# Patient Record
Sex: Male | Born: 1955 | Race: Black or African American | Hispanic: No | Marital: Single | Smoking: Current some day smoker
Health system: Southern US, Community
[De-identification: ages and names within clinical notes are randomized; demographics above are authoritative.]

## PROBLEM LIST (undated history)

## (undated) DIAGNOSIS — F419 Anxiety disorder, unspecified: Secondary | ICD-10-CM

## (undated) DIAGNOSIS — M549 Dorsalgia, unspecified: Secondary | ICD-10-CM

## (undated) HISTORY — PX: ROTATOR CUFF REPAIR: SHX139

---

## 2009-03-31 ENCOUNTER — Emergency Department (HOSPITAL_COMMUNITY): Admission: EM | Admit: 2009-03-31 | Discharge: 2009-03-31 | Payer: Self-pay | Admitting: Emergency Medicine

## 2009-09-15 ENCOUNTER — Emergency Department (HOSPITAL_COMMUNITY): Admission: EM | Admit: 2009-09-15 | Discharge: 2009-09-15 | Payer: Self-pay | Admitting: Emergency Medicine

## 2009-09-16 ENCOUNTER — Emergency Department (HOSPITAL_COMMUNITY): Admission: EM | Admit: 2009-09-16 | Discharge: 2009-09-16 | Payer: Self-pay | Admitting: Emergency Medicine

## 2009-09-21 ENCOUNTER — Emergency Department (HOSPITAL_COMMUNITY): Admission: EM | Admit: 2009-09-21 | Discharge: 2009-09-21 | Payer: Self-pay | Admitting: Emergency Medicine

## 2009-10-02 ENCOUNTER — Emergency Department (HOSPITAL_COMMUNITY): Admission: EM | Admit: 2009-10-02 | Discharge: 2009-10-02 | Payer: Self-pay | Admitting: Emergency Medicine

## 2009-12-22 ENCOUNTER — Emergency Department (HOSPITAL_COMMUNITY): Admission: EM | Admit: 2009-12-22 | Discharge: 2009-12-22 | Payer: Self-pay | Admitting: Emergency Medicine

## 2010-06-16 ENCOUNTER — Ambulatory Visit: Payer: Self-pay | Admitting: Physician Assistant

## 2010-06-16 ENCOUNTER — Telehealth: Payer: Self-pay | Admitting: Physician Assistant

## 2010-06-16 DIAGNOSIS — K219 Gastro-esophageal reflux disease without esophagitis: Secondary | ICD-10-CM

## 2010-06-16 DIAGNOSIS — M545 Low back pain: Secondary | ICD-10-CM

## 2010-06-16 LAB — CONVERTED CEMR LAB
AST: 28 units/L (ref 0–37)
Albumin: 4.5 g/dL (ref 3.5–5.2)
Alkaline Phosphatase: 70 units/L (ref 39–117)
BUN: 14 mg/dL (ref 6–23)
Basophils Relative: 0 % (ref 0–1)
Blood in Urine, dipstick: NEGATIVE
Eosinophils Absolute: 0.1 10*3/uL (ref 0.0–0.7)
Eosinophils Relative: 2 % (ref 0–5)
HCT: 41.7 % (ref 39.0–52.0)
Ketones, urine, test strip: NEGATIVE
MCHC: 32.6 g/dL (ref 30.0–36.0)
MCV: 92.5 fL (ref 78.0–100.0)
Monocytes Relative: 6 % (ref 3–12)
Neutrophils Relative %: 59 % (ref 43–77)
Nitrite: NEGATIVE
Platelets: 275 10*3/uL (ref 150–400)
Potassium: 4.6 meq/L (ref 3.5–5.3)
Protein, U semiquant: NEGATIVE
Sodium: 138 meq/L (ref 135–145)
Total Bilirubin: 0.2 mg/dL — ABNORMAL LOW (ref 0.3–1.2)
WBC Urine, dipstick: NEGATIVE

## 2010-06-17 ENCOUNTER — Telehealth: Payer: Self-pay | Admitting: Physician Assistant

## 2010-06-17 ENCOUNTER — Encounter: Payer: Self-pay | Admitting: Physician Assistant

## 2010-06-25 ENCOUNTER — Ambulatory Visit: Payer: Self-pay | Admitting: Physician Assistant

## 2010-06-25 LAB — CONVERTED CEMR LAB
Amphetamine Screen, Ur: NEGATIVE
Benzodiazepines.: NEGATIVE
Cocaine Metabolites: NEGATIVE
Marijuana Metabolite: NEGATIVE
Opiate Screen, Urine: NEGATIVE
Phencyclidine (PCP): NEGATIVE
Propoxyphene: NEGATIVE

## 2010-06-28 ENCOUNTER — Encounter: Payer: Self-pay | Admitting: Physician Assistant

## 2010-08-27 ENCOUNTER — Encounter (INDEPENDENT_AMBULATORY_CARE_PROVIDER_SITE_OTHER): Payer: Self-pay | Admitting: *Deleted

## 2010-09-01 ENCOUNTER — Encounter: Admission: RE | Admit: 2010-09-01 | Discharge: 2010-09-01 | Payer: Self-pay | Admitting: Nephrology

## 2010-10-13 ENCOUNTER — Encounter: Admit: 2010-10-13 | Payer: Self-pay | Admitting: Internal Medicine

## 2010-11-16 NOTE — Progress Notes (Signed)
Summary: Did not receive urine for Drug screen  Phone Note From Other Clinic   Summary of Call: Call from [facility] lab -- had order for drug screen, but no urine was received.   Initial call taken by: Dutch Quint RN,  June 17, 2010 12:26 PM  Follow-up for Phone Call        needs to be repeated  Follow-up by: Brynda Rim,  June 17, 2010 2:16 PM  Additional Follow-up for Phone Call Additional follow up Details #1::        Lab appt. 06/24/10.  Dutch Quint RN  June 22, 2010 9:41 AM

## 2010-11-16 NOTE — Letter (Signed)
Summary: *HSN Results Follow up  [facility]  [address] [place], [place] [address]   Phone: [phone]  Fax: [phone]      06/28/2010   Levaughn Glynn [address] [place], [place]  [address]   Dear  Mr. Newman Nip,                            ____S.Drinkard,FNP   ____D. Gore,FNP       ____B. McPherson,MD   ____V. Rankins,MD    ____E. Mulberry,MD    ____N. Daphine Deutscher, FNP  ____D. Reche Dixon, MD    ____K. Philipp Deputy, MD    __x__S. Alben Spittle, PA-C     This letter is to inform you that your recent test(s):  _______Pap Smear    __x____Lab Test     _______X-ray    ___x____ is within acceptable limits  _______ requires a medication change  _______ requires a follow-up lab visit  _______ requires a follow-up visit with your provider   Comments: Urine test is ok.       _________________________________________________________ If you have any questions, please contact our office                     Sincerely,  Tereso Newcomer PA-C [facility]

## 2010-11-16 NOTE — Letter (Signed)
Summary: *HSN Results Follow up  [facility]  [address] [place], [place] [address]   Phone: [phone]  Fax: [phone]      08/27/2010   Trevel Hinton [address] [place], [place]  [address]   Dear  Mr. Newman Nip,                            ____S.Drinkard,FNP   ____D. Gore,FNP       ____B. McPherson,MD   ____V. Rankins,MD    ____E. Mulberry,MD    ____N. Daphine Deutscher, FNP  ____D. Reche Dixon, MD    ____K. Philipp Deputy, MD    __X__Other     This letter is to inform you that your recent test(s):  _______Pap Smear    _______Lab Test     _______X-ray    _______ is within acceptable limits  _______ requires a medication change  _______ requires a follow-up lab visit  _______ requires a follow-up visit with your provider   Comments: I BEING TRYING TO CONTACT YOU BY PHONE REGARDING YOUR NOS TO YOUR PHYSICAL THERAPY .IF YOU CAN PLEASE RETURN MY CALL  OR CALL [phone] TO RESCHEDULE YOUR APPT. THANK YOU       _________________________________________________________ If you have any questions, please contact our office                     Sincerely,  Cheryll Dessert [facility]

## 2010-11-16 NOTE — Letter (Signed)
Summary: MAILED REQUESTED RECORDS [place] [facility] [place]  MAILED REQUESTED RECORDS [place] [facility] [place]   Imported By: Arta Bruce 06/25/2010 10:47:41  _____________________________________________________________________  External Attachment:    Type:   Image     Comment:   External Document
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
## 2010-11-16 NOTE — Progress Notes (Signed)
Summary: PT referral  Phone Note Outgoing Call   Summary of Call: Refer to PT for low back pain  Initial call taken by: Brynda Rim,  June 16, 2010 5:52 PM  Follow-up for Phone Call        PT HAVE AN APPT PT 07-01-10 @ 11:45AM  LVM TO PT TO RETURN MY CALL AND I WILL GIVE HIM HIS APPT  Follow-up by: Cheryll Dessert,  June 23, 2010 11:32 AM

## 2010-11-16 NOTE — Letter (Signed)
Summary: *HSN Results Follow up  [facility]  [address] [place], [place] [address]   Phone: [phone]  Fax: [phone]      06/17/2010   Askia Balik [address] [place], [place]  [address]   Dear  Mr. Newman Nip,                            ____S.Drinkard,FNP   ____D. Gore,FNP       ____B. McPherson,MD   ____V. Rankins,MD    ____E. Mulberry,MD    ____N. Daphine Deutscher, FNP  ____D. Reche Dixon, MD    ____K. Philipp Deputy, MD    __x__S. Alben Spittle, PA-C     This letter is to inform you that your recent test(s):  _______Pap Smear    ___x____Lab Test     _______X-ray    ___x____ is within acceptable limits  _______ requires a medication change  ___x____ requires a follow-up lab visit  _______ requires a follow-up visit with your provider   Comments: Blood counts, liver and kidney function and thyroid all normal.  Please call us to arrange a lab appointment to recollect your urine.       _________________________________________________________ If you have any questions, please contact our office                     Sincerely,  Tereso Newcomer PA-C [facility]

## 2010-11-30 ENCOUNTER — Emergency Department (HOSPITAL_COMMUNITY)
Admission: EM | Admit: 2010-11-30 | Discharge: 2010-11-30 | Disposition: A | Discharge status: Home / Self Care | Payer: Medicaid Other | Attending: Emergency Medicine | Admitting: Emergency Medicine

## 2010-11-30 DIAGNOSIS — M545 Low back pain, unspecified: Secondary | ICD-10-CM | POA: Insufficient documentation

## 2010-11-30 DIAGNOSIS — M25519 Pain in unspecified shoulder: Secondary | ICD-10-CM | POA: Insufficient documentation

## 2010-11-30 DIAGNOSIS — F329 Major depressive disorder, single episode, unspecified: Secondary | ICD-10-CM | POA: Insufficient documentation

## 2010-11-30 DIAGNOSIS — F3289 Other specified depressive episodes: Secondary | ICD-10-CM | POA: Insufficient documentation

## 2010-12-02 ENCOUNTER — Emergency Department (HOSPITAL_COMMUNITY): Payer: Medicaid Other

## 2010-12-02 ENCOUNTER — Emergency Department (HOSPITAL_COMMUNITY)
Admission: EM | Admit: 2010-12-02 | Discharge: 2010-12-02 | Disposition: A | Discharge status: Home / Self Care | Payer: Medicaid Other | Attending: Emergency Medicine | Admitting: Emergency Medicine

## 2010-12-02 DIAGNOSIS — S92919A Unspecified fracture of unspecified toe(s), initial encounter for closed fracture: Secondary | ICD-10-CM | POA: Insufficient documentation

## 2010-12-02 DIAGNOSIS — W208XXA Other cause of strike by thrown, projected or falling object, initial encounter: Secondary | ICD-10-CM | POA: Insufficient documentation

## 2011-03-31 ENCOUNTER — Encounter (HOSPITAL_BASED_OUTPATIENT_CLINIC_OR_DEPARTMENT_OTHER)
Admission: RE | Admit: 2011-03-31 | Discharge: 2011-03-31 | Disposition: A | Discharge status: Home / Self Care | Payer: Medicaid Other | Source: Ambulatory Visit | Attending: Orthopedic Surgery | Admitting: Orthopedic Surgery

## 2011-04-04 ENCOUNTER — Ambulatory Visit (HOSPITAL_BASED_OUTPATIENT_CLINIC_OR_DEPARTMENT_OTHER)
Admission: RE | Admit: 2011-04-04 | Discharge: 2011-04-04 | Disposition: A | Discharge status: Home / Self Care | Payer: Medicaid Other | Source: Ambulatory Visit | Attending: Orthopedic Surgery | Admitting: Orthopedic Surgery

## 2011-04-04 DIAGNOSIS — F17200 Nicotine dependence, unspecified, uncomplicated: Secondary | ICD-10-CM | POA: Insufficient documentation

## 2011-04-04 DIAGNOSIS — M67919 Unspecified disorder of synovium and tendon, unspecified shoulder: Secondary | ICD-10-CM | POA: Insufficient documentation

## 2011-04-04 DIAGNOSIS — M19019 Primary osteoarthritis, unspecified shoulder: Secondary | ICD-10-CM | POA: Insufficient documentation

## 2011-04-04 DIAGNOSIS — J449 Chronic obstructive pulmonary disease, unspecified: Secondary | ICD-10-CM | POA: Insufficient documentation

## 2011-04-04 DIAGNOSIS — Z01810 Encounter for preprocedural cardiovascular examination: Secondary | ICD-10-CM | POA: Insufficient documentation

## 2011-04-04 DIAGNOSIS — J4489 Other specified chronic obstructive pulmonary disease: Secondary | ICD-10-CM | POA: Insufficient documentation

## 2011-04-04 DIAGNOSIS — Z01812 Encounter for preprocedural laboratory examination: Secondary | ICD-10-CM | POA: Insufficient documentation

## 2011-04-04 DIAGNOSIS — M25819 Other specified joint disorders, unspecified shoulder: Secondary | ICD-10-CM | POA: Insufficient documentation

## 2011-04-04 DIAGNOSIS — G43909 Migraine, unspecified, not intractable, without status migrainosus: Secondary | ICD-10-CM | POA: Insufficient documentation

## 2011-04-04 DIAGNOSIS — M719 Bursopathy, unspecified: Secondary | ICD-10-CM | POA: Insufficient documentation
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
## 2011-06-28 ENCOUNTER — Ambulatory Visit: Payer: Medicaid Other | Attending: Orthopedic Surgery | Admitting: Physical Therapy

## 2011-06-28 DIAGNOSIS — M25619 Stiffness of unspecified shoulder, not elsewhere classified: Secondary | ICD-10-CM | POA: Insufficient documentation

## 2011-06-28 DIAGNOSIS — IMO0001 Reserved for inherently not codable concepts without codable children: Secondary | ICD-10-CM | POA: Insufficient documentation

## 2011-06-28 DIAGNOSIS — M25519 Pain in unspecified shoulder: Secondary | ICD-10-CM | POA: Insufficient documentation

## 2011-06-28 DIAGNOSIS — M6281 Muscle weakness (generalized): Secondary | ICD-10-CM | POA: Insufficient documentation

## 2011-06-28 DIAGNOSIS — R5381 Other malaise: Secondary | ICD-10-CM | POA: Insufficient documentation

## 2011-07-04 ENCOUNTER — Ambulatory Visit: Payer: Medicaid Other

## 2011-07-08 ENCOUNTER — Emergency Department (HOSPITAL_COMMUNITY): Payer: Medicaid Other

## 2011-07-08 ENCOUNTER — Emergency Department (HOSPITAL_COMMUNITY)
Admission: EM | Admit: 2011-07-08 | Discharge: 2011-07-08 | Disposition: A | Discharge status: Home / Self Care | Payer: Medicaid Other | Attending: Emergency Medicine | Admitting: Emergency Medicine

## 2011-07-08 DIAGNOSIS — R0602 Shortness of breath: Secondary | ICD-10-CM | POA: Insufficient documentation

## 2011-07-08 DIAGNOSIS — R0989 Other specified symptoms and signs involving the circulatory and respiratory systems: Secondary | ICD-10-CM | POA: Insufficient documentation

## 2011-07-08 DIAGNOSIS — F411 Generalized anxiety disorder: Secondary | ICD-10-CM | POA: Insufficient documentation

## 2011-07-08 DIAGNOSIS — R0609 Other forms of dyspnea: Secondary | ICD-10-CM | POA: Insufficient documentation

## 2011-07-08 DIAGNOSIS — R079 Chest pain, unspecified: Secondary | ICD-10-CM | POA: Insufficient documentation

## 2011-07-08 LAB — DIFFERENTIAL
Basophils Relative: 0 % (ref 0–1)
Lymphocytes Relative: 23 % (ref 12–46)
Lymphs Abs: 2.3 10*3/uL (ref 0.7–4.0)
Monocytes Absolute: 0.6 10*3/uL (ref 0.1–1.0)
Monocytes Relative: 6 % (ref 3–12)
Neutro Abs: 6.9 10*3/uL (ref 1.7–7.7)
Neutrophils Relative %: 71 % (ref 43–77)

## 2011-07-08 LAB — D-DIMER, QUANTITATIVE: D-Dimer, Quant: <0.22 ug/mL-FEU (ref 0.00–0.48)

## 2011-07-08 LAB — BASIC METABOLIC PANEL
CO2: 24 mEq/L (ref 19–32)
Calcium: 8.9 mg/dL (ref 8.4–10.5)
Chloride: 97 mEq/L (ref 96–112)
Creatinine, Ser: 0.86 mg/dL (ref 0.50–1.35)
Glucose, Bld: 136 mg/dL — ABNORMAL HIGH (ref 70–99)

## 2011-07-08 LAB — CBC
HCT: 41.3 % (ref 39.0–52.0)
Hemoglobin: 14.2 g/dL (ref 13.0–17.0)
MCH: 30.4 pg (ref 26.0–34.0)
MCV: 88.4 fL (ref 78.0–100.0)
Platelets: 271 10*3/uL (ref 150–400)
RBC: 4.67 MIL/uL (ref 4.22–5.81)
WBC: 9.8 10*3/uL (ref 4.0–10.5)

## 2011-07-08 LAB — POCT I-STAT TROPONIN I
Troponin i, poc: 0 ng/mL (ref 0.00–0.08)
Troponin i, poc: 0 ng/mL (ref 0.00–0.08)

## 2011-07-19 ENCOUNTER — Ambulatory Visit: Payer: Medicaid Other | Attending: Orthopedic Surgery | Admitting: Physical Therapy

## 2011-07-19 DIAGNOSIS — IMO0001 Reserved for inherently not codable concepts without codable children: Secondary | ICD-10-CM | POA: Insufficient documentation

## 2011-07-19 DIAGNOSIS — R5381 Other malaise: Secondary | ICD-10-CM | POA: Insufficient documentation

## 2011-07-19 DIAGNOSIS — M25619 Stiffness of unspecified shoulder, not elsewhere classified: Secondary | ICD-10-CM | POA: Insufficient documentation

## 2011-07-19 DIAGNOSIS — M6281 Muscle weakness (generalized): Secondary | ICD-10-CM | POA: Insufficient documentation

## 2011-07-19 DIAGNOSIS — M25519 Pain in unspecified shoulder: Secondary | ICD-10-CM | POA: Insufficient documentation

## 2011-07-26 ENCOUNTER — Encounter: Payer: Medicaid Other | Admitting: Physical Therapy

## 2011-08-01 ENCOUNTER — Encounter: Payer: Medicaid Other | Admitting: Physical Therapy

## 2011-08-18 ENCOUNTER — Emergency Department (HOSPITAL_COMMUNITY)
Admission: EM | Admit: 2011-08-18 | Discharge: 2011-08-18 | Disposition: A | Discharge status: Home / Self Care | Payer: Medicaid Other | Attending: Emergency Medicine | Admitting: Emergency Medicine

## 2011-08-18 DIAGNOSIS — Z79899 Other long term (current) drug therapy: Secondary | ICD-10-CM | POA: Insufficient documentation

## 2011-08-18 DIAGNOSIS — R071 Chest pain on breathing: Secondary | ICD-10-CM | POA: Insufficient documentation

## 2011-08-18 DIAGNOSIS — M549 Dorsalgia, unspecified: Secondary | ICD-10-CM | POA: Insufficient documentation

## 2011-08-18 DIAGNOSIS — G8929 Other chronic pain: Secondary | ICD-10-CM | POA: Insufficient documentation

## 2011-08-18 DIAGNOSIS — F411 Generalized anxiety disorder: Secondary | ICD-10-CM | POA: Insufficient documentation

## 2011-08-18 LAB — DIFFERENTIAL
Basophils Relative: 0 % (ref 0–1)
Eosinophils Absolute: 0.1 10*3/uL (ref 0.0–0.7)
Neutro Abs: 3.6 10*3/uL (ref 1.7–7.7)
Neutrophils Relative %: 63 % (ref 43–77)

## 2011-08-18 LAB — POCT I-STAT, CHEM 8
Calcium, Ion: 1.12 mmol/L (ref 1.12–1.32)
Chloride: 107 mEq/L (ref 96–112)
HCT: 41 % (ref 39.0–52.0)
Sodium: 140 mEq/L (ref 135–145)
TCO2: 25 mmol/L (ref 0–100)

## 2011-08-18 LAB — URINALYSIS, ROUTINE W REFLEX MICROSCOPIC
Bilirubin Urine: NEGATIVE
Glucose, UA: NEGATIVE mg/dL
Hgb urine dipstick: NEGATIVE
Ketones, ur: NEGATIVE mg/dL
Specific Gravity, Urine: 1.022 (ref 1.005–1.030)
pH: 6 (ref 5.0–8.0)

## 2011-08-18 LAB — RAPID URINE DRUG SCREEN, HOSP PERFORMED
Amphetamines: NOT DETECTED
Barbiturates: NOT DETECTED
Benzodiazepines: NOT DETECTED
Cocaine: NOT DETECTED

## 2011-08-18 LAB — CBC
Hemoglobin: 13.1 g/dL (ref 13.0–17.0)
Platelets: 230 10*3/uL (ref 150–400)
RBC: 4.34 MIL/uL (ref 4.22–5.81)
WBC: 5.6 10*3/uL (ref 4.0–10.5)

## 2012-04-22 ENCOUNTER — Encounter (HOSPITAL_COMMUNITY): Payer: Self-pay

## 2012-04-22 ENCOUNTER — Emergency Department (HOSPITAL_COMMUNITY): Payer: Medicaid Other

## 2012-04-22 ENCOUNTER — Emergency Department (HOSPITAL_COMMUNITY)
Admission: EM | Admit: 2012-04-22 | Discharge: 2012-04-22 | Disposition: A | Discharge status: Home / Self Care | Payer: Medicaid Other | Attending: Emergency Medicine | Admitting: Emergency Medicine

## 2012-04-22 DIAGNOSIS — R109 Unspecified abdominal pain: Secondary | ICD-10-CM

## 2012-04-22 DIAGNOSIS — R1013 Epigastric pain: Secondary | ICD-10-CM | POA: Insufficient documentation

## 2012-04-22 HISTORY — DX: Dorsalgia, unspecified: M54.9

## 2012-04-22 LAB — COMPREHENSIVE METABOLIC PANEL
ALT: 64 U/L — ABNORMAL HIGH (ref 0–53)
Alkaline Phosphatase: 70 U/L (ref 39–117)
BUN: 11 mg/dL (ref 6–23)
CO2: 24 mEq/L (ref 19–32)
Chloride: 101 mEq/L (ref 96–112)
GFR calc Af Amer: >90 mL/min (ref >90)
Glucose, Bld: 104 mg/dL — ABNORMAL HIGH (ref 70–99)
Potassium: 3.8 mEq/L (ref 3.5–5.1)
Sodium: 135 mEq/L (ref 135–145)
Total Bilirubin: 0.2 mg/dL — ABNORMAL LOW (ref 0.3–1.2)

## 2012-04-22 LAB — CBC WITH DIFFERENTIAL/PLATELET
Hemoglobin: 13.5 g/dL (ref 13.0–17.0)
Lymphocytes Relative: 39 % (ref 12–46)
Lymphs Abs: 2.1 10*3/uL (ref 0.7–4.0)
MCH: 29.9 pg (ref 26.0–34.0)
Monocytes Relative: 7 % (ref 3–12)
Neutro Abs: 2.7 10*3/uL (ref 1.7–7.7)
Neutrophils Relative %: 51 % (ref 43–77)
Platelets: 215 10*3/uL (ref 150–400)
RBC: 4.51 MIL/uL (ref 4.22–5.81)
WBC: 5.3 10*3/uL (ref 4.0–10.5)

## 2012-04-22 LAB — LIPASE, BLOOD: Lipase: 45 U/L (ref 11–59)

## 2012-04-22 MED ORDER — TRAMADOL HCL 50 MG PO TABS
50.0000 mg | ORAL_TABLET | Freq: Once | ORAL | Status: AC
  Administered 2012-04-22: 50 mg via ORAL
  Filled 2012-04-22: qty 1

## 2012-04-22 MED ORDER — TRAMADOL HCL 50 MG PO TABS: 50.0000 mg | ORAL_TABLET | Freq: Four times a day (QID) | ORAL | Status: AC | PRN

## 2012-04-22 MED ORDER — FAMOTIDINE IN NACL 20-0.9 MG/50ML-% IV SOLN
20.0000 mg | INTRAVENOUS | Status: AC
  Administered 2012-04-22: 20 mg via INTRAVENOUS
  Filled 2012-04-22: qty 50

## 2012-04-22 MED ORDER — FAMOTIDINE 20 MG PO TABS: 20.0000 mg | ORAL_TABLET | Freq: Two times a day (BID) | ORAL | Status: DC

## 2012-04-22 MED ORDER — SODIUM CHLORIDE 0.9 % IV BOLUS (SEPSIS)
1000.0000 mL | Freq: Once | INTRAVENOUS | Status: AC
  Administered 2012-04-22: 1000 mL via INTRAVENOUS

## 2012-04-22 NOTE — ED Notes (Signed)
Pt in from home with abd pain with nausea x1 week states possible food poisoning denies vomiting and diarrhea states pain is epigastric denies pain radiaitng

## 2012-04-22 NOTE — ED Notes (Signed)
Rx given x Pt ambulating independently w/ steady gait on d/c in no acute distress, A&Ox4. D/c instructions reviewed w/ pt - pt denies any further questions or concerns at present.

## 2012-04-22 NOTE — ED Provider Notes (Signed)
History     CSN: [id number]  Arrival date & time 04/22/12  1340   First MD Initiated Contact with Patient 04/22/12 1452      Chief Complaint  Patient presents with  . Abdominal Pain    (Consider location/radiation/quality/duration/timing/severity/associated sxs/prior treatment) The history is provided by the patient.    56 y/o male INAD c/o epigastric pain x7 days. Pt Had 2x episodes of NBNB, non coffee ground appearance emesis x7 days ago. Pt has beeen tolerating PO and has had a good appetite. Pt reports colicky pain starting 20 minutes after eating to epigastrium. Pt was constipated but has been taking peptobismol and had a BM today. Pt denies fever/chills, N/V  Past Medical History  Diagnosis Date  . Back pain     History reviewed. No pertinent past surgical history.  No family history on file.  History  Substance Use Topics  . Smoking status: Former Games developer  . Smokeless tobacco: Not on file  . Alcohol Use: Yes      Review of Systems  Gastrointestinal: Positive for abdominal pain and constipation. Negative for nausea and vomiting.  All other systems reviewed and are negative.    Allergies  Review of patient's allergies indicates no known allergies.  Home Medications   Current Outpatient Rx  Name Route Sig Dispense Refill  . ALPRAZOLAM 0.5 MG PO TABS Oral Take 0.5 mg by mouth 2 (two) times daily. anxiety    . DICLOFENAC SODIUM 1 % TD GEL Topical Apply 1 application topically See admin instructions.    Marland Kitchen HYDROCODONE-ACETAMINOPHEN 5-500 MG PO CAPS Oral Take 1 capsule by mouth 2 (two) times daily.    . OXYCODONE-ACETAMINOPHEN 5-325 MG PO TABS Oral Take 1 tablet by mouth 3 (three) times daily. pain      BP 145/95  Pulse 59  Temp 97.6 F (36.4 C) (Oral)  Resp 18  SpO2 100%  Physical Exam  Nursing note and vitals reviewed. Constitutional: He is oriented to person, place, and time. He appears well-developed and well-nourished. No distress.  HENT:  Head:  Normocephalic.  Eyes: Conjunctivae and EOM are normal.  Cardiovascular: Normal rate.   Pulmonary/Chest: Effort normal.  Abdominal: Soft. Bowel sounds are normal. He exhibits no distension and no mass. There is tenderness. There is no rebound and no guarding.       Murphy's sign ?pos. Diffuse upper quadrant tenderness  Musculoskeletal: Normal range of motion.  Neurological: He is alert and oriented to person, place, and time.  Psychiatric: He has a normal mood and affect.    ED Course  Procedures (including critical care time)  Labs Reviewed  COMPREHENSIVE METABOLIC PANEL - Abnormal; Notable for the following:    Glucose, Bld 104 (*)     Albumin 3.4 (*)     ALT 64 (*)     Total Bilirubin 0.2 (*)     All other components within normal limits  CBC WITH DIFFERENTIAL  LIPASE, BLOOD  H. PYLORI ANTIBODY, IGG   US Abdomen Complete  04/22/2012  *RADIOLOGY REPORT*  Clinical Data:  Abdominal pain for the past 2 weeks.  COMPLETE ABDOMINAL ULTRASOUND  Comparison:  None.  Findings:  Gallbladder:  No gallstones, gallbladder wall thickening, or pericholecystic fluid.  Common bile duct:  Normal in caliber, measuring 3.5 mm in diameter proximally.  Liver:  Minimally echogenic.  IVC:  Appears normal.  Pancreas:  The pancreatic tail is obscured by overlying bowel gas. The remainder of the pancreas appears normal.  Spleen:  Normal,  measuring 4.7 cm in length.  Right Kidney:  Normal, measuring 11.0 cm in length.  Left Kidney:  Normal, measuring 11.5 cm in length.  Abdominal aorta:  No aneurysm identified.  IMPRESSION:  1.  Minimally echogenic liver, most likely due to minimal steatosis. 2.  Non-visualized pancreatic tail due to overlying bowel gas. 3.  No gallstones or acute abnormality.  Original Report Authenticated By: Darrol Angel, M.D.     1. Abdominal pain       MDM  Abdominal discomfort resolved. Serial abdominal exams benign. VSS. Will d/c with close follow up by his PCP.  Pt verbalized  understanding and agrees with care plan. Outpatient follow-up and return precautions given.           Wynetta Emery, PA-C 04/22/12 1916

## 2012-04-22 NOTE — ED Provider Notes (Signed)
Medical screening examination/treatment/procedure(s) were performed by non-physician practitioner and as supervising physician I was immediately available for consultation/collaboration.  Shelda Jakes, MD 04/22/12 [phone]

## 2012-04-23 LAB — H. PYLORI ANTIBODY, IGG: H Pylori IgG: 6.74 {ISR} — ABNORMAL HIGH

## 2012-04-23 MED ORDER — OXYCODONE-ACETAMINOPHEN 5-325 MG PO TABS: 2.0000 | ORAL_TABLET | ORAL | Status: AC | PRN

## 2012-09-20 ENCOUNTER — Emergency Department (HOSPITAL_COMMUNITY)
Admission: EM | Admit: 2012-09-20 | Discharge: 2012-09-20 | Disposition: A | Discharge status: Home / Self Care | Payer: No Typology Code available for payment source | Attending: Emergency Medicine | Admitting: Emergency Medicine

## 2012-09-20 ENCOUNTER — Encounter (HOSPITAL_COMMUNITY): Payer: Self-pay | Admitting: *Deleted

## 2012-09-20 DIAGNOSIS — J40 Bronchitis, not specified as acute or chronic: Secondary | ICD-10-CM

## 2012-09-20 DIAGNOSIS — M549 Dorsalgia, unspecified: Secondary | ICD-10-CM | POA: Insufficient documentation

## 2012-09-20 DIAGNOSIS — Y92410 Unspecified street and highway as the place of occurrence of the external cause: Secondary | ICD-10-CM | POA: Insufficient documentation

## 2012-09-20 DIAGNOSIS — S139XXA Sprain of joints and ligaments of unspecified parts of neck, initial encounter: Secondary | ICD-10-CM | POA: Insufficient documentation

## 2012-09-20 DIAGNOSIS — F17200 Nicotine dependence, unspecified, uncomplicated: Secondary | ICD-10-CM | POA: Insufficient documentation

## 2012-09-20 DIAGNOSIS — R05 Cough: Secondary | ICD-10-CM | POA: Insufficient documentation

## 2012-09-20 DIAGNOSIS — Y9389 Activity, other specified: Secondary | ICD-10-CM | POA: Insufficient documentation

## 2012-09-20 DIAGNOSIS — R059 Cough, unspecified: Secondary | ICD-10-CM | POA: Insufficient documentation

## 2012-09-20 DIAGNOSIS — S161XXA Strain of muscle, fascia and tendon at neck level, initial encounter: Secondary | ICD-10-CM

## 2012-09-20 DIAGNOSIS — R062 Wheezing: Secondary | ICD-10-CM | POA: Insufficient documentation

## 2012-09-20 MED ORDER — ALBUTEROL SULFATE HFA 108 (90 BASE) MCG/ACT IN AERS
2.0000 | INHALATION_SPRAY | RESPIRATORY_TRACT | Status: DC | PRN
  Administered 2012-09-20: 2 via RESPIRATORY_TRACT
  Filled 2012-09-20: qty 6.7

## 2012-09-20 MED ORDER — METHOCARBAMOL 500 MG PO TABS: 1000.0000 mg | ORAL_TABLET | Freq: Four times a day (QID) | ORAL | Status: DC

## 2012-09-20 MED ORDER — OXYCODONE-ACETAMINOPHEN 5-325 MG PO TABS: 1.0000 | ORAL_TABLET | Freq: Four times a day (QID) | ORAL | Status: DC | PRN

## 2012-09-20 MED ORDER — AEROCHAMBER Z-STAT PLUS/MEDIUM MISC: 1.0000 | Freq: Once | Status: DC
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
## 2012-09-22 NOTE — ED Provider Notes (Signed)
Medical screening examination/treatment/procedure(s) were performed by non-physician practitioner and as supervising physician I was immediately available for consultation/collaboration.  Daevon Holdren T Tracey Hermance, MD 09/22/12 0711 

## 2012-09-23 ENCOUNTER — Emergency Department (INDEPENDENT_AMBULATORY_CARE_PROVIDER_SITE_OTHER)
Admission: EM | Admit: 2012-09-23 | Discharge: 2012-09-23 | Disposition: A | Discharge status: Home / Self Care | Payer: Medicaid Other | Source: Home / Self Care | Attending: Emergency Medicine | Admitting: Emergency Medicine

## 2012-09-23 ENCOUNTER — Emergency Department (INDEPENDENT_AMBULATORY_CARE_PROVIDER_SITE_OTHER): Payer: Medicaid Other

## 2012-09-23 ENCOUNTER — Encounter (HOSPITAL_COMMUNITY): Payer: Self-pay | Admitting: Emergency Medicine

## 2012-09-23 DIAGNOSIS — S161XXA Strain of muscle, fascia and tendon at neck level, initial encounter: Secondary | ICD-10-CM

## 2012-09-23 DIAGNOSIS — S139XXA Sprain of joints and ligaments of unspecified parts of neck, initial encounter: Secondary | ICD-10-CM

## 2012-09-23 DIAGNOSIS — S335XXA Sprain of ligaments of lumbar spine, initial encounter: Secondary | ICD-10-CM

## 2012-09-23 DIAGNOSIS — S39012A Strain of muscle, fascia and tendon of lower back, initial encounter: Secondary | ICD-10-CM

## 2012-09-23 MED ORDER — TIZANIDINE HCL 4 MG PO TABS: 4.0000 mg | ORAL_TABLET | Freq: Four times a day (QID) | ORAL | Status: DC | PRN

## 2012-09-23 MED ORDER — OXYCODONE-ACETAMINOPHEN 5-325 MG PO TABS: ORAL_TABLET | ORAL | Status: DC
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
## 2013-03-06 ENCOUNTER — Other Ambulatory Visit: Payer: Self-pay | Admitting: Physical Medicine and Rehabilitation

## 2013-03-06 DIAGNOSIS — G939 Disorder of brain, unspecified: Secondary | ICD-10-CM

## 2013-03-07 ENCOUNTER — Other Ambulatory Visit: Payer: Medicaid Other

## 2013-03-15 ENCOUNTER — Ambulatory Visit
Admission: RE | Admit: 2013-03-15 | Discharge: 2013-03-15 | Disposition: A | Discharge status: Home / Self Care | Payer: Medicaid Other | Source: Ambulatory Visit | Attending: Physical Medicine and Rehabilitation | Admitting: Physical Medicine and Rehabilitation

## 2013-03-15 DIAGNOSIS — G939 Disorder of brain, unspecified: Secondary | ICD-10-CM

## 2013-03-15 MED ORDER — GADOBENATE DIMEGLUMINE 529 MG/ML IV SOLN
18.0000 mL | Freq: Once | INTRAVENOUS | Status: AC | PRN
  Administered 2013-03-15: 18 mL via INTRAVENOUS

## 2013-03-17 ENCOUNTER — Encounter (HOSPITAL_COMMUNITY): Payer: Self-pay | Admitting: Emergency Medicine

## 2013-03-17 ENCOUNTER — Emergency Department (HOSPITAL_COMMUNITY)
Admission: EM | Admit: 2013-03-17 | Discharge: 2013-03-17 | Disposition: A | Discharge status: Home / Self Care | Payer: Medicaid Other | Attending: Emergency Medicine | Admitting: Emergency Medicine

## 2013-03-17 DIAGNOSIS — J302 Other seasonal allergic rhinitis: Secondary | ICD-10-CM

## 2013-03-17 DIAGNOSIS — G8929 Other chronic pain: Secondary | ICD-10-CM | POA: Insufficient documentation

## 2013-03-17 DIAGNOSIS — M25519 Pain in unspecified shoulder: Secondary | ICD-10-CM | POA: Insufficient documentation

## 2013-03-17 DIAGNOSIS — F17200 Nicotine dependence, unspecified, uncomplicated: Secondary | ICD-10-CM | POA: Insufficient documentation

## 2013-03-17 DIAGNOSIS — J309 Allergic rhinitis, unspecified: Secondary | ICD-10-CM | POA: Insufficient documentation

## 2013-03-17 DIAGNOSIS — Z79899 Other long term (current) drug therapy: Secondary | ICD-10-CM | POA: Insufficient documentation

## 2013-03-17 MED ORDER — DIPHENHYDRAMINE HCL 25 MG PO CAPS
25.0000 mg | ORAL_CAPSULE | Freq: Once | ORAL | Status: AC
  Administered 2013-03-17: 25 mg via ORAL
  Filled 2013-03-17: qty 1

## 2013-03-17 MED ORDER — HYDROCODONE-ACETAMINOPHEN 5-325 MG PO TABS: ORAL_TABLET | ORAL | Status: DC

## 2013-03-17 MED ORDER — HYDROCODONE-ACETAMINOPHEN 5-325 MG PO TABS
1.0000 | ORAL_TABLET | Freq: Once | ORAL | Status: AC
  Administered 2013-03-17: 1 via ORAL
  Filled 2013-03-17: qty 1

## 2013-03-17 MED ORDER — KETOROLAC TROMETHAMINE 30 MG/ML IJ SOLN
30.0000 mg | Freq: Once | INTRAMUSCULAR | Status: AC
  Administered 2013-03-17: 30 mg via INTRAMUSCULAR
  Filled 2013-03-17: qty 1

## 2013-03-17 NOTE — ED Provider Notes (Signed)
History     CSN: [id number]  Arrival date & time 03/17/13  1121   First MD Initiated Contact with Patient 03/17/13 1144      Chief Complaint  Patient presents with  . Back Pain    (Consider location/radiation/quality/duration/timing/severity/associated sxs/prior treatment) HPI  Zachary Sims. is a 57 y.o. male complaining of exacerbation of chronic left shoulder pain. Patient states he require surgery to the shoulder he states he has an MRI that was performed 2 days ago. He also states he is out of his pain medication. Patient denies recent trauma, numbness, paresthesia. Patient also reports rhinorrhea, he denies fever, cough, shortness of breath. States his pain is severe, 8/10.  Past Medical History  Diagnosis Date  . Back pain     Past Surgical History  Procedure Laterality Date  . Rotator cuff repair      No family history on file.  History  Substance Use Topics  . Smoking status: Current Some Day Smoker    Types: Cigarettes  . Smokeless tobacco: Not on file  . Alcohol Use: Yes      Review of Systems  Constitutional: Negative for fever.  HENT: Positive for rhinorrhea.   Respiratory: Negative for shortness of breath.   Cardiovascular: Negative for chest pain.  Gastrointestinal: Negative for nausea, vomiting, abdominal pain and diarrhea.  Musculoskeletal: Positive for arthralgias.  All other systems reviewed and are negative.    Allergies  Ibuprofen and Tramadol  Home Medications   Current Outpatient Rx  Name  Route  Sig  Dispense  Refill  . ALPRAZolam (XANAX) 0.5 MG tablet   Oral   Take 0.5 mg by mouth 2 (two) times daily. anxiety         . diclofenac sodium (VOLTAREN) 1 % GEL   Topical   Apply 1 application topically See admin instructions.         Marland Kitchen HYDROcodone-acetaminophen (NORCO/VICODIN) 5-325 MG per tablet      Take 1-2 tablets by mouth every 6 hours as needed for pain.   10 tablet   0   . methocarbamol (ROBAXIN) 500 MG tablet  Oral   Take 2 tablets (1,000 mg total) by mouth 4 (four) times daily.   20 tablet   0   . oxyCODONE-acetaminophen (PERCOCET) 5-325 MG per tablet   Oral   Take 1 tablet by mouth 3 (three) times daily. pain         . oxyCODONE-acetaminophen (PERCOCET) 5-325 MG per tablet      1 to 2 tablets every 6 hours as needed for pain.   20 tablet   0   . oxyCODONE-acetaminophen (PERCOCET/ROXICET) 5-325 MG per tablet   Oral   Take 1-2 tablets by mouth every 6 (six) hours as needed for pain.   6 tablet   0   . tiZANidine (ZANAFLEX) 4 MG tablet   Oral   Take 1 tablet (4 mg total) by mouth every 6 (six) hours as needed.   30 tablet   0     BP 138/84  Pulse 64  Temp(Src) 98 F (36.7 C) (Oral)  Resp 18  SpO2 98%  Physical Exam  Nursing note and vitals reviewed. Constitutional: He is oriented to person, place, and time. He appears well-developed and well-nourished. No distress.  HENT:  Head: Normocephalic and atraumatic.  Mouth/Throat: Oropharynx is clear and moist.  Tenderness to palpation of the sinuses, posterior pharynx is mildly injected, there is no anterior cervical lymphadenopathy.  Eyes: Conjunctivae and  EOM are normal. Pupils are equal, round, and reactive to light.  Cardiovascular: Normal rate, regular rhythm and intact distal pulses.   Pulmonary/Chest: Effort normal and breath sounds normal. No stridor. No respiratory distress. He has no wheezes. He has no rales. He exhibits no tenderness.  Abdominal: Soft. Bowel sounds are normal. He exhibits no distension. There is no tenderness. There is no rebound and no guarding.  Musculoskeletal: Normal range of motion. He exhibits no edema.  Patient is unable to abduct left arm greater than 90. He is diffusely tender to palpation along rotator cuff musculature.  Neurovascularly intact  Neurological: He is alert and oriented to person, place, and time.  Psychiatric: He has a normal mood and affect.    ED Course  Procedures  (including critical care time)  Labs Reviewed - No data to display Mr Laqueta Jean Wo Contrast  03/15/2013   *RADIOLOGY REPORT*  Clinical Data: Lesion of the occipital skull.  MRI HEAD WITHOUT AND WITH CONTRAST  Technique:  Multiplanar, multiecho pulse sequences of the brain and surrounding structures were obtained according to standard protocol without and with intravenous contrast  Contrast: 18mL MULTIHANCE GADOBENATE DIMEGLUMINE 529 MG/ML IV SOLN  Comparison: Report of MRI of the cervical spine at [facility] 09/04/2012.  Findings: A 5.7 x 5.4 x 2.1 cm heterogeneous lesion is evident within the occipital skull.  The dural sinuses are slightly displaced without invasion.  There is no abnormality of the underlying brain.  The dural sinuses are patent.  The transverse sinuses are symmetric.  The lesion demonstrates heterogeneous enhancement node as well defined.  No other focal osseous abnormality is evident.  2 or 3 subcortical T2 hyperintensities are within normal limits for age.  No acute infarct, hemorrhage, mass lesion is present otherwise.  Flow is present in the major intracranial arteries. The globes and orbits are intact.  Mild mucosal thickening is present in the ethmoid air cells and sphenoid sinuses bilaterally. The mastoid air cells are clear.  IMPRESSION:  1.  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 CT of the head would be useful for further evaluation. 2.  Normal MRI appearance the brain for age. 3.  Minimal sinus disease.   Original Report Authenticated By: Marin Roberts, M.D.     1. Chronic shoulder pain, left   2. Seasonal allergies       MDM   Filed Vitals:   03/17/13 1141  BP: 138/84  Pulse: 64  Temp: 98 F (36.7 C)  TempSrc: Oral  Resp: 18  SpO2: 98%     [name] is a 57 y.o. male patient with chronic pain and seasonal allergies.  Medications  ketorolac (TORADOL) 30  MG/ML injection 30 mg (30 mg Intramuscular Given 03/17/13 1237)  HYDROcodone-acetaminophen (NORCO/VICODIN) 5-325 MG per tablet 1 tablet (1 tablet Oral Given 03/17/13 1236)  diphenhydrAMINE (BENADRYL) capsule 25 mg (25 mg Oral Given 03/17/13 1256)    The patient is hemodynamically stable, appropriate for, and amenable to, discharge at this time. Pt verbalized understanding and agrees with care plan. Outpatient follow-up and return precautions given.    Discharge Medication List as of 03/17/2013 12:07 PM    START taking these medications   Details  HYDROcodone-acetaminophen (NORCO/VICODIN) 5-325 MG per tablet Take 1-2 tablets by mouth every 6 hours as needed for pain., Print             [employer], PA-C  03/17/13 1603 

## 2013-03-17 NOTE — ED Notes (Signed)
NAD noted at time of d/c home 

## 2013-03-17 NOTE — ED Notes (Signed)
Pt. In PEDS dept. With his daughter.

## 2013-03-17 NOTE — ED Notes (Signed)
Joni Reining, PA-C in room at this time

## 2013-03-17 NOTE — ED Provider Notes (Signed)
Medical screening examination/treatment/procedure(s) were performed by non-physician practitioner and as supervising physician I was immediately available for consultation/collaboration.  Doug Sou, MD 03/17/13 [phone]

## 2013-03-17 NOTE — ED Notes (Signed)
Pt with c/o chronic back pain and shoulder pain. Pt reports his PCP and pain mgt doctor lost his license to practice and he is out of his medications. Has been referred to pain mgt MD at [facility] but pt states "I don't want to go there". Pt also reports his allergies have been "acting up"

## 2013-03-21 ENCOUNTER — Emergency Department (HOSPITAL_COMMUNITY): Payer: Medicaid Other

## 2013-03-21 ENCOUNTER — Emergency Department (HOSPITAL_COMMUNITY)
Admission: EM | Admit: 2013-03-21 | Discharge: 2013-03-21 | Disposition: A | Discharge status: Home / Self Care | Payer: Medicaid Other | Attending: Emergency Medicine | Admitting: Emergency Medicine

## 2013-03-21 ENCOUNTER — Encounter (HOSPITAL_COMMUNITY): Payer: Self-pay

## 2013-03-21 DIAGNOSIS — J40 Bronchitis, not specified as acute or chronic: Secondary | ICD-10-CM

## 2013-03-21 DIAGNOSIS — M25519 Pain in unspecified shoulder: Secondary | ICD-10-CM | POA: Insufficient documentation

## 2013-03-21 DIAGNOSIS — Z9889 Other specified postprocedural states: Secondary | ICD-10-CM | POA: Insufficient documentation

## 2013-03-21 DIAGNOSIS — G8929 Other chronic pain: Secondary | ICD-10-CM | POA: Insufficient documentation

## 2013-03-21 DIAGNOSIS — Z79899 Other long term (current) drug therapy: Secondary | ICD-10-CM | POA: Insufficient documentation

## 2013-03-21 DIAGNOSIS — F17200 Nicotine dependence, unspecified, uncomplicated: Secondary | ICD-10-CM | POA: Insufficient documentation

## 2013-03-21 MED ORDER — AEROCHAMBER PLUS W/MASK MISC
Status: AC
  Administered 2013-03-21: 10:00:00
  Filled 2013-03-21: qty 1

## 2013-03-21 MED ORDER — BENZONATATE 100 MG PO CAPS: 100.0000 mg | ORAL_CAPSULE | Freq: Three times a day (TID) | ORAL | Status: DC

## 2013-03-21 MED ORDER — BENZONATATE 100 MG PO CAPS
100.0000 mg | ORAL_CAPSULE | ORAL | Status: AC
  Administered 2013-03-21: 100 mg via ORAL
  Filled 2013-03-21: qty 1

## 2013-03-21 MED ORDER — ALBUTEROL SULFATE HFA 108 (90 BASE) MCG/ACT IN AERS
2.0000 | INHALATION_SPRAY | Freq: Once | RESPIRATORY_TRACT | Status: AC
  Administered 2013-03-21: 2 via RESPIRATORY_TRACT
  Filled 2013-03-21: qty 6.7
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
## 2013-03-21 NOTE — ED Provider Notes (Signed)
History     CSN: [id number]  Arrival date & time 03/21/13  0903   First MD Initiated Contact with Patient 03/21/13 [phone]      Chief Complaint  Patient presents with  . Cough    (Consider location/radiation/quality/duration/timing/severity/associated sxs/prior treatment) HPI Comments: 57 year old male who has a history of chronic back pain, chronic shoulder pain and who is on multiple pain medications and muscle relaxants. He presents to the hospital with a complaint of a cough which he states has been present for approximately one month, intermittent, gradually worsening, associated with yellow phlegm production but no fevers chills nausea or vomiting. Taking a deep breath and exertion makes this cough worse. He works doing Systems developer work and states it has been hard to do this in the heat outside. He denies swelling of his legs, headache, dyspnea on exertion, chest pain. He has no history of heart disease, no history of congestive heart failure, a distant history of cocaine use approximately 7 or 8 years ago and he is a current everyday smoker.  The patient also complains of shoulder pain which she states is a chronic problem for which she has not been able to take his chronic pain medications because of losing his family Dr.  Patient is a 57 y.o. male presenting with shoulder pain. The history is provided by the patient.  Shoulder Pain This is a chronic problem.    Past Medical History  Diagnosis Date  . Back pain     Past Surgical History  Procedure Laterality Date  . Rotator cuff repair      No family history on file.  History  Substance Use Topics  . Smoking status: Current Some Day Smoker    Types: Cigarettes  . Smokeless tobacco: Not on file  . Alcohol Use: Yes      Review of Systems  All other systems reviewed and are negative.    Allergies  Ibuprofen; Tramadol; and Vicodin  Home Medications   Current Outpatient Rx  Name  Route  Sig  Dispense   Refill  . ALPRAZolam (XANAX) 0.5 MG tablet   Oral   Take 0.5 mg by mouth 2 (two) times daily. anxiety         . diclofenac sodium (VOLTAREN) 1 % GEL   Topical   Apply 1 application topically See admin instructions.         Marland Kitchen oxyCODONE-acetaminophen (PERCOCET) 7.5-325 MG per tablet   Oral   Take 1-2 tablets by mouth every 4 (four) hours as needed for pain.         . pantoprazole (PROTONIX) 40 MG tablet   Oral   Take 40 mg by mouth daily.         . benzonatate (TESSALON) 100 MG capsule   Oral   Take 1 capsule (100 mg total) by mouth every 8 (eight) hours.   21 capsule   0   . HYDROcodone-acetaminophen (NORCO/VICODIN) 5-325 MG per tablet      Take 1-2 tablets by mouth every 6 hours as needed for pain.   10 tablet   0   . tiZANidine (ZANAFLEX) 4 MG tablet   Oral   Take 1 tablet (4 mg total) by mouth every 6 (six) hours as needed.   30 tablet   0     BP 161/83  Pulse 88  Temp(Src) 98.9 F (37.2 C) (Oral)  Resp 20  SpO2 100%  Physical Exam  Nursing note and vitals reviewed. Constitutional: He appears  well-developed and well-nourished. No distress.  HENT:  Head: Normocephalic and atraumatic.  Mouth/Throat: Oropharynx is clear and moist. No oropharyngeal exudate.  Nasal passages with swollen turbinates, tympanic membranes clear, oropharynx with moist mucous membranes, no erythema exudate asymmetry or hypertrophy. Phonation is normal  Eyes: Conjunctivae and EOM are normal. Pupils are equal, round, and reactive to light. Right eye exhibits no discharge. Left eye exhibits no discharge. No scleral icterus.  Neck: Normal range of motion. Neck supple. No JVD present. No thyromegaly present.  Very supple neck with no lymphadenopathy  Cardiovascular: Normal rate, regular rhythm, normal heart sounds and intact distal pulses.  Exam reveals no gallop and no friction rub.   No murmur heard. Strong peripheral pulses, no murmurs, heart rate of 60  Pulmonary/Chest: Effort  normal and breath sounds normal. No respiratory distress. He has no wheezes. He has no rales.  No wheezing rhonchi or rales, speaking in full sentences  Abdominal: Soft. Bowel sounds are normal. He exhibits no distension and no mass. There is no tenderness.  Musculoskeletal: Normal range of motion. He exhibits no edema and no tenderness.  No swelling, asymmetry or peripheral edema  Lymphadenopathy:    He has no cervical adenopathy.  Neurological: He is alert. Coordination normal.  Skin: Skin is warm and dry. No rash noted. No erythema.  Psychiatric: He has a normal mood and affect. His behavior is normal.    ED Course  Procedures (including critical care time)  Labs Reviewed - No data to display Dg Chest 2 View  03/21/2013   *RADIOLOGY REPORT*  Clinical Data: Shortness of breath and cough.  CHEST - 2 VIEW  Comparison: 07/08/2011.  Findings: The cardiac silhouette, mediastinal and hilar contours are normal and stable.  The lungs are clear.  No pleural effusion. The bony thorax is intact.  IMPRESSION: No acute cardiopulmonary findings.   Original Report Authenticated By: Rudie Meyer, M.D.     1. Bronchitis   2. Chronic pain       MDM  The patient has frequent coughing fits during exam, he has no rales, wheezing or other evidence of pulmonary edema, there is no peripheral edema and the patient has no wheezing to suggest this is his acute COPD or CHF. I suspect that he has bronchitis given the one month of cough with productive sputum but normal vital signs. He may also have an element of allergies as he does have frequent sneezing fits, frequent coughing fits and sore throat. The patient will need to be treated with antihistamines, albuterol inhaler metered-dose inhaler with a spacer, chest x-ray to evaluate for other types of infection or pulmonary mass.  I have personally reviewed the x-ray and find her to be no signs of infiltrate or masses. The patient's vital signs remained stable,  medications given, patient appears stable for discharge with referral to family doctor.   Meds given in ED:  Medications  benzonatate (TESSALON) capsule 100 mg (100 mg Oral Given 03/21/13 0954)  albuterol (PROVENTIL HFA;VENTOLIN HFA) 108 (90 BASE) MCG/ACT inhaler 2 puff (2 puffs Inhalation Given 03/21/13 0938)  aerochamber plus with mask device (  Given 03/21/13 0938)    New Prescriptions   BENZONATATE (TESSALON) 100 MG CAPSULE    Take 1 capsule (100 mg total) by mouth every 8 (eight) hours.         Vida Roller, MD 03/21/13 1020

## 2013-04-02 ENCOUNTER — Encounter (HOSPITAL_COMMUNITY): Payer: Self-pay | Admitting: Emergency Medicine

## 2013-04-02 ENCOUNTER — Emergency Department (HOSPITAL_COMMUNITY): Payer: No Typology Code available for payment source

## 2013-04-02 ENCOUNTER — Emergency Department (HOSPITAL_COMMUNITY)
Admission: EM | Admit: 2013-04-02 | Discharge: 2013-04-02 | Disposition: A | Discharge status: Home / Self Care | Payer: No Typology Code available for payment source | Attending: Emergency Medicine | Admitting: Emergency Medicine

## 2013-04-02 DIAGNOSIS — M25512 Pain in left shoulder: Secondary | ICD-10-CM

## 2013-04-02 DIAGNOSIS — F17200 Nicotine dependence, unspecified, uncomplicated: Secondary | ICD-10-CM | POA: Insufficient documentation

## 2013-04-02 DIAGNOSIS — Z79899 Other long term (current) drug therapy: Secondary | ICD-10-CM | POA: Insufficient documentation

## 2013-04-02 DIAGNOSIS — S4980XA Other specified injuries of shoulder and upper arm, unspecified arm, initial encounter: Secondary | ICD-10-CM | POA: Insufficient documentation

## 2013-04-02 DIAGNOSIS — IMO0002 Reserved for concepts with insufficient information to code with codable children: Secondary | ICD-10-CM | POA: Insufficient documentation

## 2013-04-02 DIAGNOSIS — Y9389 Activity, other specified: Secondary | ICD-10-CM | POA: Insufficient documentation

## 2013-04-02 DIAGNOSIS — S46909A Unspecified injury of unspecified muscle, fascia and tendon at shoulder and upper arm level, unspecified arm, initial encounter: Secondary | ICD-10-CM | POA: Insufficient documentation

## 2013-04-02 DIAGNOSIS — M542 Cervicalgia: Secondary | ICD-10-CM

## 2013-04-02 DIAGNOSIS — Y92410 Unspecified street and highway as the place of occurrence of the external cause: Secondary | ICD-10-CM | POA: Insufficient documentation

## 2013-04-02 DIAGNOSIS — M545 Low back pain: Secondary | ICD-10-CM

## 2013-04-02 MED ORDER — ACETAMINOPHEN 325 MG PO TABS
650.0000 mg | ORAL_TABLET | Freq: Once | ORAL | Status: AC
  Administered 2013-04-02: 650 mg via ORAL
  Filled 2013-04-02: qty 2

## 2013-04-02 MED ORDER — CYCLOBENZAPRINE HCL 10 MG PO TABS: 10.0000 mg | ORAL_TABLET | Freq: Two times a day (BID) | ORAL | Status: DC | PRN

## 2013-04-02 MED ORDER — CYCLOBENZAPRINE HCL 10 MG PO TABS
5.0000 mg | ORAL_TABLET | Freq: Once | ORAL | Status: AC
  Administered 2013-04-02: 5 mg via ORAL
  Filled 2013-04-02: qty 1

## 2013-04-02 NOTE — ED Notes (Signed)
Patient transported to X-ray 
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
## 2013-04-10 ENCOUNTER — Encounter (HOSPITAL_COMMUNITY): Payer: Self-pay | Admitting: Emergency Medicine

## 2013-04-10 ENCOUNTER — Emergency Department (INDEPENDENT_AMBULATORY_CARE_PROVIDER_SITE_OTHER)
Admission: EM | Admit: 2013-04-10 | Discharge: 2013-04-10 | Disposition: A | Discharge status: Home / Self Care | Payer: Medicaid Other | Source: Home / Self Care | Attending: Emergency Medicine | Admitting: Emergency Medicine

## 2013-04-10 DIAGNOSIS — M25519 Pain in unspecified shoulder: Secondary | ICD-10-CM

## 2013-04-10 DIAGNOSIS — M545 Low back pain: Secondary | ICD-10-CM

## 2013-04-10 DIAGNOSIS — M25512 Pain in left shoulder: Secondary | ICD-10-CM

## 2013-04-10 HISTORY — DX: Anxiety disorder, unspecified: F41.9

## 2013-04-10 MED ORDER — LIDOCAINE 5 % EX PTCH: MEDICATED_PATCH | CUTANEOUS | Status: DC

## 2013-04-10 MED ORDER — CYCLOBENZAPRINE HCL 10 MG PO TABS: 10.0000 mg | ORAL_TABLET | Freq: Three times a day (TID) | ORAL | Status: DC | PRN

## 2013-04-10 MED ORDER — OXYCODONE-ACETAMINOPHEN 5-325 MG PO TABS: 1.0000 | ORAL_TABLET | Freq: Four times a day (QID) | ORAL | Status: DC | PRN
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
## 2013-04-10 NOTE — ED Provider Notes (Signed)
Medical screening examination/treatment/procedure(s) were performed by non-physician practitioner and as supervising physician I was immediately available for consultation/collaboration.  Raynald Blend, MD 04/10/13 1306
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
## 2013-05-03 ENCOUNTER — Encounter (HOSPITAL_COMMUNITY): Payer: Self-pay | Admitting: Emergency Medicine

## 2013-05-03 ENCOUNTER — Emergency Department (HOSPITAL_COMMUNITY)
Admission: EM | Admit: 2013-05-03 | Discharge: 2013-05-03 | Disposition: A | Discharge status: Home / Self Care | Payer: No Typology Code available for payment source | Attending: Emergency Medicine | Admitting: Emergency Medicine

## 2013-05-03 ENCOUNTER — Emergency Department (HOSPITAL_COMMUNITY): Payer: No Typology Code available for payment source

## 2013-05-03 DIAGNOSIS — S335XXA Sprain of ligaments of lumbar spine, initial encounter: Secondary | ICD-10-CM | POA: Insufficient documentation

## 2013-05-03 DIAGNOSIS — S161XXA Strain of muscle, fascia and tendon at neck level, initial encounter: Secondary | ICD-10-CM

## 2013-05-03 DIAGNOSIS — F17200 Nicotine dependence, unspecified, uncomplicated: Secondary | ICD-10-CM | POA: Insufficient documentation

## 2013-05-03 DIAGNOSIS — S39012A Strain of muscle, fascia and tendon of lower back, initial encounter: Secondary | ICD-10-CM

## 2013-05-03 DIAGNOSIS — Y92410 Unspecified street and highway as the place of occurrence of the external cause: Secondary | ICD-10-CM | POA: Insufficient documentation

## 2013-05-03 DIAGNOSIS — S43402D Unspecified sprain of left shoulder joint, subsequent encounter: Secondary | ICD-10-CM

## 2013-05-03 DIAGNOSIS — Y9389 Activity, other specified: Secondary | ICD-10-CM | POA: Insufficient documentation

## 2013-05-03 DIAGNOSIS — Z8659 Personal history of other mental and behavioral disorders: Secondary | ICD-10-CM | POA: Insufficient documentation

## 2013-05-03 DIAGNOSIS — IMO0002 Reserved for concepts with insufficient information to code with codable children: Secondary | ICD-10-CM | POA: Insufficient documentation

## 2013-05-03 DIAGNOSIS — S139XXA Sprain of joints and ligaments of unspecified parts of neck, initial encounter: Secondary | ICD-10-CM | POA: Insufficient documentation

## 2013-05-03 MED ORDER — CYCLOBENZAPRINE HCL 10 MG PO TABS: 10.0000 mg | ORAL_TABLET | Freq: Two times a day (BID) | ORAL | Status: DC | PRN
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
## 2013-05-04 NOTE — ED Provider Notes (Signed)
Medical screening examination/treatment/procedure(s) were performed by non-physician practitioner and as supervising physician I was immediately available for consultation/collaboration.  Doug Sou, MD 05/04/13 [phone]

## 2013-05-09 ENCOUNTER — Emergency Department (HOSPITAL_COMMUNITY): Payer: No Typology Code available for payment source

## 2013-05-09 ENCOUNTER — Emergency Department (HOSPITAL_COMMUNITY)
Admission: EM | Admit: 2013-05-09 | Discharge: 2013-05-09 | Disposition: A | Discharge status: Home / Self Care | Payer: No Typology Code available for payment source | Attending: Emergency Medicine | Admitting: Emergency Medicine

## 2013-05-09 ENCOUNTER — Encounter (HOSPITAL_COMMUNITY): Payer: Self-pay | Admitting: Family Medicine

## 2013-05-09 DIAGNOSIS — Z79899 Other long term (current) drug therapy: Secondary | ICD-10-CM | POA: Insufficient documentation

## 2013-05-09 DIAGNOSIS — Y92410 Unspecified street and highway as the place of occurrence of the external cause: Secondary | ICD-10-CM | POA: Insufficient documentation

## 2013-05-09 DIAGNOSIS — Z8659 Personal history of other mental and behavioral disorders: Secondary | ICD-10-CM | POA: Insufficient documentation

## 2013-05-09 DIAGNOSIS — S92909A Unspecified fracture of unspecified foot, initial encounter for closed fracture: Secondary | ICD-10-CM | POA: Insufficient documentation

## 2013-05-09 DIAGNOSIS — Y9389 Activity, other specified: Secondary | ICD-10-CM | POA: Insufficient documentation

## 2013-05-09 DIAGNOSIS — F17200 Nicotine dependence, unspecified, uncomplicated: Secondary | ICD-10-CM | POA: Insufficient documentation

## 2013-05-09 DIAGNOSIS — S92902A Unspecified fracture of left foot, initial encounter for closed fracture: Secondary | ICD-10-CM

## 2013-05-09 MED ORDER — OXYCODONE-ACETAMINOPHEN 5-325 MG PO TABS: 1.0000 | ORAL_TABLET | ORAL | Status: DC | PRN

## 2013-05-09 NOTE — Progress Notes (Signed)
Orthopedic Tech Progress Note Patient Details:  Zachary Sims. 02-26-1956 [id number]  Ortho Devices Type of Ortho Device: Ace wrap;Post (short leg) splint;Crutches Ortho Device/Splint Location: LLE Ortho Device/Splint Interventions: Ordered;Application   Jennye Moccasin 05/09/2013, 9:28 PM

## 2013-05-09 NOTE — ED Provider Notes (Signed)
Medical screening examination/treatment/procedure(s) were performed by non-physician practitioner and as supervising physician I was immediately available for consultation/collaboration.   Jaivian Battaglini B. Bernette Mayers, MD 05/09/13 2321

## 2013-05-09 NOTE — ED Notes (Signed)
Pt states understanding of discharge instructions 
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
## 2013-08-03 ENCOUNTER — Encounter (HOSPITAL_COMMUNITY): Payer: Self-pay | Admitting: Emergency Medicine

## 2013-08-03 ENCOUNTER — Emergency Department (HOSPITAL_COMMUNITY)
Admission: EM | Admit: 2013-08-03 | Discharge: 2013-08-03 | Disposition: A | Discharge status: Home / Self Care | Payer: No Typology Code available for payment source | Attending: Emergency Medicine | Admitting: Emergency Medicine

## 2013-08-03 DIAGNOSIS — Z791 Long term (current) use of non-steroidal anti-inflammatories (NSAID): Secondary | ICD-10-CM | POA: Insufficient documentation

## 2013-08-03 DIAGNOSIS — Z9889 Other specified postprocedural states: Secondary | ICD-10-CM | POA: Insufficient documentation

## 2013-08-03 DIAGNOSIS — F411 Generalized anxiety disorder: Secondary | ICD-10-CM | POA: Insufficient documentation

## 2013-08-03 DIAGNOSIS — R112 Nausea with vomiting, unspecified: Secondary | ICD-10-CM | POA: Insufficient documentation

## 2013-08-03 DIAGNOSIS — G8918 Other acute postprocedural pain: Secondary | ICD-10-CM

## 2013-08-03 DIAGNOSIS — F17200 Nicotine dependence, unspecified, uncomplicated: Secondary | ICD-10-CM | POA: Insufficient documentation

## 2013-08-03 DIAGNOSIS — Z79899 Other long term (current) drug therapy: Secondary | ICD-10-CM | POA: Insufficient documentation

## 2013-08-03 DIAGNOSIS — M25519 Pain in unspecified shoulder: Secondary | ICD-10-CM | POA: Insufficient documentation

## 2013-08-03 MED ORDER — ONDANSETRON 4 MG PO TBDP
4.0000 mg | ORAL_TABLET | Freq: Once | ORAL | Status: AC
  Administered 2013-08-03: 4 mg via ORAL
  Filled 2013-08-03: qty 1

## 2013-08-03 MED ORDER — OXYCODONE-ACETAMINOPHEN 5-325 MG PO TABS
2.0000 | ORAL_TABLET | Freq: Once | ORAL | Status: AC
  Administered 2013-08-03: 2 via ORAL
  Filled 2013-08-03: qty 2

## 2013-08-03 NOTE — ED Notes (Signed)
Per EMS patient had shoulder surgery on 10/16. Was given percocet which upset his stomach. Patient had stomach ulcers and was unable to take percocet. Went to MD on Friday to get new pain medication but MD only addressed the nausea. Patient unaware that he only had medication for nausea last night.

## 2013-08-03 NOTE — ED Provider Notes (Signed)
CSN: [id number]     Arrival date & time 08/03/13  1016 History   First MD Initiated Contact with Patient 08/03/13 1135     Chief Complaint  Patient presents with  . Shoulder Pain    HPI  Zachary Sims. is a 57 y.o. male with a PMH of back pain and anixeyt who presents to the ED for evaluation of shoulder pain.  History was provided by the patient.  Patient states that he had a rotator cuff surgery done on 08/01/13 by Dr. Ave Filter with [facility]. No new injuries post surgical intervention.  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 He states that he was prescribed Percocet for pain, however it is making him "sick".  Patient reports nausea and emesis with no emesis today.  He states he went to his orthopedic physician yesterday and informed them of his reaction to the medication. He states that they called in in a antiemetic prescription (unknown). He states that he was unable to fill this prescription because he does not have the money right now. Patient has not taken anything for pain today because he "does not want to be sick." Patient states that he talked to a pharmacist who told him that he is reacting to the dye in the 5 mg tablets and he requires 10 mg Percocet tablets.  He otherwise is doing well no fevers, weakness, loss of sensation, drainage from the wound, abdominal pain, diarrhea, constipation, or dysuria.  He denies any chest pain, shortness of breath, headache, dizziness, lightheadedness, or nausea currently.  He has a follow-up appointment in 4 days with his surgeon.     Past Medical History  Diagnosis Date  . Back pain   . Anxiety    Past Surgical History  Procedure Laterality Date  . Rotator cuff repair     No family history on file. History  Substance Use Topics  . Smoking status: Current Some Day Smoker    Types: Cigarettes  . Smokeless tobacco: Not on file  . Alcohol Use: Yes     Comment: every other weekend    Review of Systems   Constitutional: Negative for fever, chills, activity change, appetite change and fatigue.  HENT: Negative for congestion, rhinorrhea and sore throat.   Eyes: Negative for visual disturbance.  Respiratory: Negative for cough, shortness of breath and wheezing.   Cardiovascular: Negative for chest pain and leg swelling.  Gastrointestinal: Positive for nausea and vomiting. Negative for abdominal pain, diarrhea and constipation.  Genitourinary: Negative for dysuria and hematuria.  Musculoskeletal: Positive for arthralgias. Negative for back pain, gait problem, joint swelling, myalgias, neck pain and neck stiffness.  Skin: Positive for wound (surgical).  Neurological: Negative for dizziness, weakness, light-headedness, numbness and headaches.  Psychiatric/Behavioral: Negative for confusion.    Allergies  Ibuprofen; Tramadol; and Vicodin  Home Medications   Current Outpatient Rx  Name  Route  Sig  Dispense  Refill  . ALPRAZolam (XANAX) 0.5 MG tablet   Oral   Take 0.5 mg by mouth 3 (three) times daily as needed for sleep or anxiety.         Marland Kitchen dexlansoprazole (DEXILANT) 60 MG capsule   Oral   Take 60 mg by mouth daily.         Marland Kitchen HYDROcodone-acetaminophen (NORCO) 10-325 MG per tablet   Oral   Take 1 tablet by mouth every 6 (six) hours as needed for pain.         Marland Kitchen  naproxen (NAPROSYN) 500 MG tablet   Oral   Take 500 mg by mouth 2 (two) times daily with a meal.         . promethazine (PHENERGAN) 25 MG tablet   Oral   Take 25 mg by mouth every 6 (six) hours as needed for nausea.         . traMADol (ULTRAM) 50 MG tablet   Oral   Take 50 mg by mouth every 6 (six) hours as needed for pain.          BP 138/78  Pulse 51  Temp(Src) 97.8 F (36.6 C)  SpO2 100%  Filed Vitals:   08/03/13 1024 08/03/13 1204 08/03/13 1230  BP: 138/78 126/76 137/73  Pulse: 51 50 50  Temp: 97.8 F (36.6 C) 98.5 F (36.9 C) 97.5 F (36.4 C)  TempSrc:  Oral Oral  Resp:  18 18  SpO2:  100% 96% 97%    Physical Exam  Nursing note and vitals reviewed. Constitutional: He is oriented to person, place, and time. He appears well-developed and well-nourished. No distress.  HENT:  Head: Normocephalic and atraumatic.  Right Ear: External ear normal.  Left Ear: External ear normal.  Mouth/Throat: Oropharynx is clear and moist.  Eyes: Conjunctivae and EOM are normal. Pupils are equal, round, and reactive to light. Right eye exhibits no discharge. Left eye exhibits no discharge.  Neck: Normal range of motion. Neck supple.  Cardiovascular: Normal rate, regular rhythm, normal heart sounds and intact distal pulses.  Exam reveals no gallop and no friction rub.   No murmur heard. Radial pulses present bilaterally  Pulmonary/Chest: Effort normal and breath sounds normal. No respiratory distress. He has no wheezes. He has no rales. He exhibits no tenderness.  Abdominal: Soft. Bowel sounds are normal. He exhibits no distension and no mass. There is no tenderness. There is no rebound and no guarding.  Musculoskeletal: Normal range of motion. He exhibits no edema and no tenderness.   No tenderness to palpation to the left arm throughout. Range of motion of the left shoulder not assessed. Patient able to actively flex and extend elbow left arm without difficulty or limitations. Patient able to actively circumduct his wrist without difficulty. Grip strength 5/5 bilaterally. Patient able to ambulate without difficulty or ataxia. Patient moving about the exam table with ease. No calf tenderness or pedal edema bilaterally  Neurological: He is alert and oriented to person, place, and time.  Sensation intact in the upper extremities  Skin: Skin is warm and dry. He is not diaphoretic.  Surgical dressing applied to the left shoulder. No signs of drainage or blood soaking through bandages. No edema to the left arm throughout.      ED Course  Procedures (including critical care time) Labs Review Labs  Reviewed - No data to display Imaging Review No results found.  EKG Interpretation   None       MDM   1. Post-op pain     Zachary Asuncion Shibata. is a 57 y.o. male with a PMH of back pain and anxiety who presents to the ED for evaluation of shoulder pain.  Percocet and Zofran ordered.  Rechecks  12:24 PM = Patient sitting up in no acute distress eating a sandwich.  No distress.  States he feels much better and that pill you gave me "did the trick." No nausea.      Patient was seen in the emergency department for followup after a shoulder surgery 2 days ago.  Patient states that he developed a "reaction to the Percocet." Patient is requesting 10 mg Percocet tablets which were not provided.  He claims he has an allergic reaction to the 5 mg tablet and requires the 10 mg tablets.  Patient was in contact with his surgeon who prescribed him an antiemetic medication for his nausea and vomiting. The patient, however, never filled this prescription due to financial concerns. He was encouraged to fill this prescription and continued to take his prescribed pain medications by his surgeon. Patient was given Percocet and Zofran in the emergency department and had no adverse reactions including nausea and vomiting. He was able to tolerate by mouth intake without difficulty.   patient is afebrile, nontoxic in appearance, remained in no acute distress.  Patient was encouraged to followup with his surgeon in his scheduled appointment in the next 2 days.  Patient was instructed to return to the ED if they experience any  chest pain, shortness of breath, weakness, loss of sensation, spreading redness/swelling, fever or other concerns.  Patient was in agreement with discharge and plan.   Patient obtaining ride home from a friend.       Final impressions: 1. Post-op pain, left shoulder     Greer Ee Grantham Hippert PA-C        Jillyn Ledger, PA-C 08/03/13 1701

## 2013-08-03 NOTE — ED Notes (Signed)
Patient eating tolerating well

## 2013-08-03 NOTE — ED Notes (Signed)
Bed: ZO10 Expected date: 08/03/13 Expected time: 10:20 AM Means of arrival: Ambulance Comments: Post shoulder surgery, pain control

## 2013-08-08 NOTE — ED Provider Notes (Signed)
Medical screening examination/treatment/procedure(s) were performed by non-physician practitioner and as supervising physician I was immediately available for consultation/collaboration.  EKG Interpretation   None        Eber Ferrufino, MD 08/08/13 1247 

## 2013-08-27 ENCOUNTER — Ambulatory Visit: Payer: No Typology Code available for payment source | Attending: Orthopedic Surgery

## 2013-08-27 DIAGNOSIS — M25519 Pain in unspecified shoulder: Secondary | ICD-10-CM | POA: Insufficient documentation

## 2013-08-27 DIAGNOSIS — IMO0001 Reserved for inherently not codable concepts without codable children: Secondary | ICD-10-CM | POA: Insufficient documentation

## 2013-08-27 DIAGNOSIS — M7989 Other specified soft tissue disorders: Secondary | ICD-10-CM | POA: Insufficient documentation

## 2013-08-27 DIAGNOSIS — R5381 Other malaise: Secondary | ICD-10-CM | POA: Insufficient documentation

## 2013-08-29 ENCOUNTER — Ambulatory Visit: Payer: No Typology Code available for payment source | Admitting: Physical Therapy

## 2013-09-05 ENCOUNTER — Ambulatory Visit: Payer: No Typology Code available for payment source | Admitting: Physical Therapy

## 2013-09-16 ENCOUNTER — Ambulatory Visit: Payer: No Typology Code available for payment source | Attending: Orthopedic Surgery | Admitting: Physical Therapy

## 2013-09-16 ENCOUNTER — Ambulatory Visit: Payer: No Typology Code available for payment source | Admitting: Physical Therapy

## 2013-09-16 DIAGNOSIS — M7989 Other specified soft tissue disorders: Secondary | ICD-10-CM | POA: Insufficient documentation

## 2013-09-16 DIAGNOSIS — R5381 Other malaise: Secondary | ICD-10-CM | POA: Insufficient documentation

## 2013-09-16 DIAGNOSIS — M25519 Pain in unspecified shoulder: Secondary | ICD-10-CM | POA: Insufficient documentation

## 2013-09-16 DIAGNOSIS — IMO0001 Reserved for inherently not codable concepts without codable children: Secondary | ICD-10-CM | POA: Insufficient documentation

## 2013-09-18 ENCOUNTER — Ambulatory Visit: Payer: No Typology Code available for payment source

## 2013-09-23 ENCOUNTER — Ambulatory Visit: Payer: No Typology Code available for payment source | Admitting: Physical Therapy

## 2013-09-25 ENCOUNTER — Ambulatory Visit: Payer: No Typology Code available for payment source | Admitting: Physical Therapy

## 2013-10-03 ENCOUNTER — Ambulatory Visit: Payer: No Typology Code available for payment source | Admitting: Physical Therapy

## 2013-10-15 ENCOUNTER — Ambulatory Visit: Payer: No Typology Code available for payment source | Admitting: Physical Therapy

## 2013-10-21 ENCOUNTER — Encounter: Payer: Medicaid Other | Admitting: Physical Therapy

## 2013-10-28 ENCOUNTER — Ambulatory Visit: Payer: No Typology Code available for payment source | Admitting: Physical Therapy

## 2013-11-06 ENCOUNTER — Ambulatory Visit: Payer: No Typology Code available for payment source | Admitting: Physical Therapy

## 2013-11-12 ENCOUNTER — Ambulatory Visit: Payer: No Typology Code available for payment source | Attending: Orthopedic Surgery | Admitting: Physical Therapy

## 2013-11-12 DIAGNOSIS — R5381 Other malaise: Secondary | ICD-10-CM | POA: Insufficient documentation

## 2013-11-12 DIAGNOSIS — IMO0001 Reserved for inherently not codable concepts without codable children: Secondary | ICD-10-CM | POA: Insufficient documentation

## 2013-11-12 DIAGNOSIS — M25519 Pain in unspecified shoulder: Secondary | ICD-10-CM | POA: Insufficient documentation

## 2013-11-12 DIAGNOSIS — M7989 Other specified soft tissue disorders: Secondary | ICD-10-CM | POA: Insufficient documentation
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
## 2014-06-05 ENCOUNTER — Emergency Department (HOSPITAL_COMMUNITY): Payer: No Typology Code available for payment source

## 2014-06-05 ENCOUNTER — Encounter (HOSPITAL_COMMUNITY): Payer: Self-pay | Admitting: Emergency Medicine

## 2014-06-05 ENCOUNTER — Emergency Department (HOSPITAL_COMMUNITY)
Admission: EM | Admit: 2014-06-05 | Discharge: 2014-06-05 | Disposition: A | Discharge status: Home / Self Care | Payer: No Typology Code available for payment source | Attending: Emergency Medicine | Admitting: Emergency Medicine

## 2014-06-05 DIAGNOSIS — S99919A Unspecified injury of unspecified ankle, initial encounter: Secondary | ICD-10-CM

## 2014-06-05 DIAGNOSIS — Y9389 Activity, other specified: Secondary | ICD-10-CM | POA: Insufficient documentation

## 2014-06-05 DIAGNOSIS — Z791 Long term (current) use of non-steroidal anti-inflammatories (NSAID): Secondary | ICD-10-CM | POA: Insufficient documentation

## 2014-06-05 DIAGNOSIS — S0993XA Unspecified injury of face, initial encounter: Secondary | ICD-10-CM | POA: Insufficient documentation

## 2014-06-05 DIAGNOSIS — S59919A Unspecified injury of unspecified forearm, initial encounter: Secondary | ICD-10-CM

## 2014-06-05 DIAGNOSIS — F411 Generalized anxiety disorder: Secondary | ICD-10-CM | POA: Insufficient documentation

## 2014-06-05 DIAGNOSIS — S199XXA Unspecified injury of neck, initial encounter: Secondary | ICD-10-CM

## 2014-06-05 DIAGNOSIS — S59909A Unspecified injury of unspecified elbow, initial encounter: Secondary | ICD-10-CM | POA: Insufficient documentation

## 2014-06-05 DIAGNOSIS — S6990XA Unspecified injury of unspecified wrist, hand and finger(s), initial encounter: Secondary | ICD-10-CM

## 2014-06-05 DIAGNOSIS — S46909A Unspecified injury of unspecified muscle, fascia and tendon at shoulder and upper arm level, unspecified arm, initial encounter: Secondary | ICD-10-CM | POA: Insufficient documentation

## 2014-06-05 DIAGNOSIS — F17200 Nicotine dependence, unspecified, uncomplicated: Secondary | ICD-10-CM | POA: Insufficient documentation

## 2014-06-05 DIAGNOSIS — IMO0002 Reserved for concepts with insufficient information to code with codable children: Secondary | ICD-10-CM | POA: Insufficient documentation

## 2014-06-05 DIAGNOSIS — S4980XA Other specified injuries of shoulder and upper arm, unspecified arm, initial encounter: Secondary | ICD-10-CM | POA: Insufficient documentation

## 2014-06-05 DIAGNOSIS — S8990XA Unspecified injury of unspecified lower leg, initial encounter: Secondary | ICD-10-CM | POA: Insufficient documentation

## 2014-06-05 DIAGNOSIS — S99929A Unspecified injury of unspecified foot, initial encounter: Secondary | ICD-10-CM

## 2014-06-05 DIAGNOSIS — Y92410 Unspecified street and highway as the place of occurrence of the external cause: Secondary | ICD-10-CM | POA: Insufficient documentation

## 2014-06-05 MED ORDER — OXYCODONE-ACETAMINOPHEN 5-325 MG PO TABS: 1.0000 | ORAL_TABLET | ORAL | Status: DC | PRN

## 2014-06-05 MED ORDER — OXYCODONE-ACETAMINOPHEN 5-325 MG PO TABS
1.0000 | ORAL_TABLET | Freq: Once | ORAL | Status: AC
  Administered 2014-06-05: 1 via ORAL
  Filled 2014-06-05: qty 1
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
## 2014-06-05 NOTE — ED Provider Notes (Signed)
Medical screening examination/treatment/procedure(s) were performed by non-physician practitioner and as supervising physician I was immediately available for consultation/collaboration.   EKG Interpretation None       Toy BakerAnthony T Zabria Liss, MD 06/05/14 2246
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
## 2014-08-22 ENCOUNTER — Emergency Department (HOSPITAL_COMMUNITY): Payer: Medicaid Other

## 2014-08-22 ENCOUNTER — Emergency Department (HOSPITAL_COMMUNITY)
Admission: EM | Admit: 2014-08-22 | Discharge: 2014-08-22 | Disposition: A | Discharge status: Home / Self Care | Payer: Medicaid Other | Attending: Emergency Medicine | Admitting: Emergency Medicine

## 2014-08-22 ENCOUNTER — Encounter (HOSPITAL_COMMUNITY): Payer: Self-pay | Admitting: Emergency Medicine

## 2014-08-22 DIAGNOSIS — S6991XA Unspecified injury of right wrist, hand and finger(s), initial encounter: Secondary | ICD-10-CM | POA: Insufficient documentation

## 2014-08-22 DIAGNOSIS — W208XXA Other cause of strike by thrown, projected or falling object, initial encounter: Secondary | ICD-10-CM | POA: Insufficient documentation

## 2014-08-22 DIAGNOSIS — Y9389 Activity, other specified: Secondary | ICD-10-CM | POA: Diagnosis not present

## 2014-08-22 DIAGNOSIS — Y9289 Other specified places as the place of occurrence of the external cause: Secondary | ICD-10-CM | POA: Insufficient documentation

## 2014-08-22 DIAGNOSIS — Z79899 Other long term (current) drug therapy: Secondary | ICD-10-CM | POA: Diagnosis not present

## 2014-08-22 DIAGNOSIS — Z720 Tobacco use: Secondary | ICD-10-CM | POA: Insufficient documentation

## 2014-08-22 DIAGNOSIS — F419 Anxiety disorder, unspecified: Secondary | ICD-10-CM | POA: Diagnosis not present

## 2014-08-22 DIAGNOSIS — S6701XA Crushing injury of right thumb, initial encounter: Secondary | ICD-10-CM | POA: Diagnosis not present

## 2014-08-22 DIAGNOSIS — M79644 Pain in right finger(s): Secondary | ICD-10-CM

## 2014-08-22 MED ORDER — OXYCODONE-ACETAMINOPHEN 5-325 MG PO TABS
1.0000 | ORAL_TABLET | Freq: Once | ORAL | Status: AC
  Administered 2014-08-22: 1 via ORAL
  Filled 2014-08-22: qty 1

## 2014-08-22 MED ORDER — OXYCODONE-ACETAMINOPHEN 5-325 MG PO TABS: 1.0000 | ORAL_TABLET | ORAL | Status: DC | PRN

## 2014-08-22 MED ORDER — NAPROXEN 500 MG PO TABS: 500.0000 mg | ORAL_TABLET | Freq: Two times a day (BID) | ORAL | Status: AC

## 2014-08-22 NOTE — ED Notes (Signed)
MD at bedside. 
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
## 2014-08-22 NOTE — Discharge Instructions (Signed)
Crush Injury, Fingers or Toes A crush injury to the fingers or toes means the tissues have been damaged by being squeezed (compressed). There will be bleeding into the tissues and swelling. Often, blood will collect under the skin. When this happens, the skin on the finger often dies and may slough off (shed) 1 week to 10 days later. Usually, new skin is growing underneath. If the injury has been too severe and the tissue does not survive, the damaged tissue may begin to turn black over several days.  Wounds which occur because of the crushing may be stitched (sutured) shut. However, crush injuries are more likely to become infected than other injuries.These wounds may not be closed as tightly as other types of cuts to prevent infection. Nails involved are often lost. These usually grow back over several weeks.  DIAGNOSIS X-rays may be taken to see if there is any injury to the bones. TREATMENT Broken bones (fractures) may be treated with splinting, depending on the fracture. Often, no treatment is required for fractures of the last bone in the fingers or toes. HOME CARE INSTRUCTIONS   The crushed part should be raised (elevated) above the heart or center of the chest as much as possible for the first several days or as directed. This helps with pain and lessens swelling. Less swelling increases the chances that the crushed part will survive.  Put ice on the injured area.  Put ice in a plastic bag.  Place a towel between your skin and the bag.  Leave the ice on for 15-20 minutes, 03-04 times a day for the first 2 days.  Only take over-the-counter or prescription medicines for pain, discomfort, or fever as directed by your caregiver.  Use your injured part only as directed.  Change your bandages (dressings) as directed.  Keep all follow-up appointments as directed by your caregiver. Not keeping your appointment could result in a chronic or permanent injury, pain, and disability. If there is  any problem keeping the appointment, you must call to reschedule. SEEK IMMEDIATE MEDICAL CARE IF:   There is redness, swelling, or increasing pain in the wound area.  Pus is coming from the wound.  You have a fever.  You notice a bad smell coming from the wound or dressing.  The edges of the wound do not stay together after the sutures have been removed.  You are unable to move the injured finger or toe. MAKE SURE YOU:   Understand these instructions.  Will watch your condition.  Will get help right away if you are not doing well or get worse. Document Released: 10/03/2005 Document Revised: 12/26/2011 Document Reviewed: 02/18/2011 [facility] Patient Information 2015 [place], [place]. This information is not intended to replace advice given to you by your health care provider. Make sure you discuss any questions you have with your health care provider.   Emergency Department Resource Guide 1) Find a Doctor and Pay Out of Pocket Although you won't have to find out who is covered by your insurance plan, it is a good idea to ask around and get recommendations. You will then need to call the office and see if the doctor you have chosen will accept you as a new patient and what types of options they offer for patients who are self-pay. Some doctors offer discounts or will set up payment plans for their patients who do not have insurance, but you will need to ask so you aren't surprised when you get to your appointment.  2) Contact Your Local Health Department Not all health departments have doctors that can see patients for sick visits, but many do, so it is worth a call to see if yours does. If you don't know where your local health department is, you can check in your phone book. The CDC also has a tool to help you locate your state's health department, and many state websites also have listings of all of their local health departments.  3) Find a Walk-in Clinic If your illness is not  likely to be very severe or complicated, you may want to try a walk in clinic. These are popping up all over the country in pharmacies, drugstores, and shopping centers. They're usually staffed by nurse practitioners or physician assistants that have been trained to treat common illnesses and complaints. They're usually fairly quick and inexpensive. However, if you have serious medical issues or chronic medical problems, these are probably not your best option.  No Primary Care Doctor: - Call Health Connect at  [phone] - they can help you locate a primary care doctor that  accepts your insurance, provides certain services, etc. - Physician Referral Service- [phone]  Chronic Pain Problems: Organization         Address  Phone   Notes  [facility]  [phone] Patients need to be referred by their primary care doctor.   Medication Assistance: Organization         Address  Phone   Notes  [facility] Medication [facility] [address] [place] [place], [place] [address] [phone] --Must be a resident of [facility] -- Must have NO insurance coverage whatsoever (no Medicaid/ Medicare, etc.) -- The pt. MUST have a primary care doctor that directs their care regularly and follows them in the community   MedAssist  [phone]   [employer]  [phone]    Agencies that provide inexpensive medical care: Organization         Address  Phone   Notes  [facility]  [phone]   [facility]    [phone]   [facility] [address] [place], [place] [address] [phone]   [facility] [address] [place], [place] [phone]   Planned Parenthood    [phone]   [facility]    [phone]   [facility]  [address], [place] Phone:  [phone], Fax:  [phone] Hours of Operation:  9 am - 6 pm,  M-F.  Also accepts Medicaid/Medicare and self-pay.  [facility]  [address], [place] Phone: [phone], Fax: [phone]. Hours of Operation:  8:30 am - 5:30 pm, M-F.  Also accepts Medicaid and self-pay.  [facility] [place] [address], [place] Phone: [phone]   [facility] [address] Natasha Bence [place], [place] [phone], Ext. [phone] Mondays & Thursdays: 7-9 AM.  First 15 patients are seen on a first come, first serve basis.    Medicaid-accepting [facility] Providers:  Organization         Address  Phone   Notes  [facility] [address], [place] [phone] Also accepts self-pay patients.  [facility] [address] Laurell Josephs [place], [place]  [phone]   [facility] [address], [place] [phone]) [phone]   [facility] [address] [place] [address], [place] [phone]   Renaye RakersVeita Bland [address], [place]   [phone]) [phone] Only accepts [place] Access [place]icaid patients after they have their name applied to their card.   Self-Pay (no insurance) in [facility]:  Organization         Address  Phone   Notes  Sickle Cell Patients, [facility] [address], [place] [phone]) [phone]   [facility] [address], [place] [phone]   [facility] [place]  [address], [place] [phone]) [phone]   [facility]/Dr. Osei-Bonsu  [address] [place] [address], [place] or [phone], [place] [phone] Phone number for both [place] and [place] locations is the same.  [facility] [address], [place] [phone]) [phone]   [facility] [place] [address] [place] [address], [place] or [address] [phone]   [facility] [address], [place] [phone], phone; [phone], fax Sees patients 1st and 3rd Saturday of every month.  Must not qualify for public or private insurance (i.e. Medicaid, Medicare, [place] Health Choice, Veterans' Benefits)  Household income should be no more than 200% of the poverty level The clinic cannot treat you if you are pregnant or think you are pregnant  Sexually transmitted diseases are not treated at the clinic.    Dental Care: Organization         Address  Phone  Notes  [facility] of [facility] [address], [place] [phone] Accepts children up to age 57 who are enrolled in [place]icaid or [place] Health Choice; pregnant women with a Medicaid card; and children who have applied for Medicaid or [place] Health Choice, but were declined, whose parents can pay a reduced fee at time of service.  [facility] [place]  [address], [place] [phone]) [phone] Accepts children up to age 57 who are enrolled in [place]icaid or [place] Health Choice; pregnant women with a Medicaid card; and children who have applied for Medicaid or [place] Health Choice, but were declined, whose parents can pay a reduced fee at time of service.  [facility]  [address], [place] [phone]) [phone] Patients are seen by appointment only. Walk-ins are not accepted. [facility] will see patients 57 years of age and older. Monday - Tuesday (8am-5pm) Most Wednesdays (8:30-5pm) $30 per visit, cash only  [facility]  [address], [facility] [phone]) [phone] Patients are seen by appointment only. Walk-ins are not accepted. [facility] will see patients 57 years of age and older. One Wednesday Evening (Monthly: Volunteer Based).  $30 per visit, cash only  [employer] [facility]  [phone] for adults; Children under age 224, call Graduate Pediatric Dentistry at [phone]  [phone]. Children aged 804-14, please call [phone] to request a pediatric application.  Dental services are provided in all areas of dental care including fillings, crowns and bridges, complete and partial dentures, implants, gum treatment, root canals, and extractions. Preventive care is also provided. Treatment is provided to both adults and children. Patients are selected via a lottery and there is often a waiting list.   [facility] [address],  [place]  [phone] [email or web address]   [facility] [address], [place], [place] [phone], Ext. [phone] Second and Fourth Thursday of each month, opens at 6:30 AM; Clinic ends at 9 AM.  Patients are seen on a first-come first-served basis, and a limited number are seen during each clinic.   [facility]  [address], [place], [place] [phone]) [phone]   Eligibility Requirements You must have lived in [place], [place], or [place] counties for at least the last three months.   You cannot be eligible for state or federal sponsored National Cityhealthcare insurance, including CIGNAVeterans Administration, [place]icaid, or [employer]Medicare.   You generally cannot be eligible for healthcare insurance through your employer.    How to apply: Eligibility screenings are held every Tuesday and Wednesday afternoon from 1:00 pm until 4:00 pm. You do not need an appointment for the interview!  [facility] [address], [place], [place] [phone]   [facility]  [phone]   [facility]  [phone]   [facility]  [phone]    Behavioral Health Resources in the Community: Intensive Outpatient Programs Organization         Address  Phone  Notes  [facility] [address], [place], [place] [phone]   [facility] [address], [place], [place] [phone]   [facility]  [address], [place], [place]  [phone]   [facility] [address],  [place], [place] [phone] or [phone]   Substance Abuse Resources Organization         Address  Phone  Notes  [facility]  [phone]   [facility]  [phone]   [facility]  [phone]   [facility]  [phone]   Residential & Outpatient Substance Abuse Program  [phone]   Psychological Services Organization         Address  Phone  Notes  [facility]  [phone]- [phone]   [facility]  [phone]- [phone]   [facility] [address], [place] [phone] or [phone]    Mobile Crisis Teams Organization         Address  Phone  Notes  [facility], Mobile Crisis Care Unit  [phone]   [facility]  [address] [place], [place] [phone]   Doristine LocksSharon DeEsch [address] [place] [phone]    Self-Help/Support Groups Organization         Address  Phone             Notes  [facility] [place] - variety of support groups  336- [phone] Call for more information  Narcotics Anonymous (NA), [facility] [address], [place]  2 meetings at this location   Statisticianesidential Treatment Programs Organization         Address  Phone  Notes  [facility] [address],    [place]  [phone]   [facility]  [address], [place] [phone], [place], [place] [phone]   [facility] [address], [place] [phone] Admissions: 8am-3pm M-F  [facility] [address]    [place], [place] [phone]   [facility] [address], [place], [place] [phone]   [facility] [address]  [place], [place] [phone]   [facility] - Intensive Outpatient [address] Laurell Josephs [address], [place], [place]  [phone]   [facility] [address] [place].,  [place], [place] [phone] or [phone]   [facility] ([facility]) [address], [place], [place] [phone] Accepts Medicaid  [facility] [address],  [place] [phone] Substance Abuse/Addiction Treatment   [facility] Organization         Address  Phone  Notes  [facility]  [phone]   Angie Fava, PhD [address] Ervin Knack [place], [place]   [phone] or [phone]   [facility]   [address] [place], [place] [phone]   [facility] [address] [place], [place] [phone] Insurance/Medicaid/sponsorship through [facility] and Families [address]                                    [place], [place] [phone] Therapy/tele-psych/case  [facility] [address], [place] [phone]    Dr. Lolly Mustache  [phone]   [facility]  [employer] [facility]. 1) [address] [place] 2) [address] [place] 3)  [address], [place] [phone]  [phone]   [facility] Child Abuse Hotline (504) 169-7943 or [phone] (After Hours)

## 2014-11-11 ENCOUNTER — Telehealth: Payer: Self-pay | Admitting: Adult Health

## 2014-11-11 ENCOUNTER — Emergency Department (HOSPITAL_COMMUNITY): Payer: Medicaid Other

## 2014-11-11 ENCOUNTER — Encounter (HOSPITAL_COMMUNITY): Payer: Self-pay | Admitting: Emergency Medicine

## 2014-11-11 ENCOUNTER — Emergency Department (HOSPITAL_COMMUNITY)
Admission: EM | Admit: 2014-11-11 | Discharge: 2014-11-11 | Disposition: A | Discharge status: Home / Self Care | Payer: Medicaid Other | Attending: Emergency Medicine | Admitting: Emergency Medicine

## 2014-11-11 DIAGNOSIS — Z79899 Other long term (current) drug therapy: Secondary | ICD-10-CM | POA: Insufficient documentation

## 2014-11-11 DIAGNOSIS — Z720 Tobacco use: Secondary | ICD-10-CM | POA: Insufficient documentation

## 2014-11-11 DIAGNOSIS — F419 Anxiety disorder, unspecified: Secondary | ICD-10-CM | POA: Insufficient documentation

## 2014-11-11 DIAGNOSIS — R079 Chest pain, unspecified: Secondary | ICD-10-CM | POA: Insufficient documentation

## 2014-11-11 DIAGNOSIS — Z791 Long term (current) use of non-steroidal anti-inflammatories (NSAID): Secondary | ICD-10-CM | POA: Insufficient documentation

## 2014-11-11 DIAGNOSIS — R51 Headache: Secondary | ICD-10-CM | POA: Diagnosis not present

## 2014-11-11 DIAGNOSIS — R05 Cough: Secondary | ICD-10-CM | POA: Insufficient documentation

## 2014-11-11 DIAGNOSIS — R519 Headache, unspecified: Secondary | ICD-10-CM

## 2014-11-11 DIAGNOSIS — R059 Cough, unspecified: Secondary | ICD-10-CM

## 2014-11-11 LAB — CBC
HCT: 41.4 % (ref 39.0–52.0)
HEMOGLOBIN: 13.7 g/dL (ref 13.0–17.0)
MCH: 29.7 pg (ref 26.0–34.0)
MCHC: 33.1 g/dL (ref 30.0–36.0)
MCV: 89.8 fL (ref 78.0–100.0)
PLATELETS: 259 10*3/uL (ref 150–400)
RBC: 4.61 MIL/uL (ref 4.22–5.81)
RDW: 14.4 % (ref 11.5–15.5)
WBC: 5.3 10*3/uL (ref 4.0–10.5)

## 2014-11-11 LAB — I-STAT TROPONIN, ED: Troponin i, poc: 0 ng/mL (ref 0.00–0.08)

## 2014-11-11 LAB — BASIC METABOLIC PANEL
ANION GAP: 8 (ref 5–15)
BUN: 19 mg/dL (ref 6–23)
CO2: 24 mmol/L (ref 19–32)
Calcium: 8.9 mg/dL (ref 8.4–10.5)
Chloride: 109 mmol/L (ref 96–112)
Creatinine, Ser: 1.02 mg/dL (ref 0.50–1.35)
GFR calc non Af Amer: 80 mL/min — ABNORMAL LOW (ref >90)
GLUCOSE: 100 mg/dL — AB (ref 70–99)
Potassium: 4.1 mmol/L (ref 3.5–5.1)
Sodium: 141 mmol/L (ref 135–145)

## 2014-11-11 NOTE — Discharge Instructions (Signed)
Cough, Adult  A cough is a reflex that helps clear your throat and airways. It can help heal the body or may be a reaction to an irritated airway. A cough may only last 2 or 3 weeks (acute) or may last more than 8 weeks (chronic).  CAUSES Acute cough:  Viral or bacterial infections. Chronic cough:  Infections.  Allergies.  Asthma.  Post-nasal drip.  Smoking.  Heartburn or acid reflux.  Some medicines.  Chronic lung problems (COPD).  Cancer. SYMPTOMS   Cough.  Fever.  Chest pain.  Increased breathing rate.  High-pitched whistling sound when breathing (wheezing).  Colored mucus that you cough up (sputum). TREATMENT   A bacterial cough may be treated with antibiotic medicine.  A viral cough must run its course and will not respond to antibiotics.  Your caregiver may recommend other treatments if you have a chronic cough. HOME CARE INSTRUCTIONS   Only take over-the-counter or prescription medicines for pain, discomfort, or fever as directed by your caregiver. Use cough suppressants only as directed by your caregiver.  Use a cold steam vaporizer or humidifier in your bedroom or home to help loosen secretions.  Sleep in a semi-upright position if your cough is worse at night.  Rest as needed.  Stop smoking if you smoke. SEEK IMMEDIATE MEDICAL CARE IF:   You have pus in your sputum.  Your cough starts to worsen.  You cannot control your cough with suppressants and are losing sleep.  You begin coughing up blood.  You have difficulty breathing.  You develop pain which is getting worse or is uncontrolled with medicine.  You have a fever. MAKE SURE YOU:   Understand these instructions.  Will watch your condition.  Will get help right away if you are not doing well or get worse. Document Released: 04/01/2011 Document Revised: 12/26/2011 Document Reviewed: 04/01/2011 ExitCare Patient Information 2015 [employer]. This information is not intended  to replace advice given to you by your health care provider. Make sure you discuss any questions you have with your health care provider.  

## 2014-11-11 NOTE — ED Notes (Signed)
Pt c/o headache, chest pain, and coughing up black sputum, states that there is black mold were he lives.

## 2014-11-11 NOTE — ED Provider Notes (Signed)
CSN: [id number]     Arrival date & time 11/11/14  4098 History   First MD Initiated Contact with Patient 11/11/14 1103     Chief Complaint  Patient presents with  . Headache  . Chest Pain     (Consider location/radiation/quality/duration/timing/severity/associated sxs/prior Treatment) HPI Comments: Patient here complaining of one-week history of coughing up black sputum with a mild headache and chest discomfort association with coughing. Denies any anginal type symptoms. Lives in a house that has black mold and is concerned this is the cause of his symptoms. Denies any fevers or night sweats. No recent weight loss. Does use tobacco products. Denies any hemoptysis. No rashes or pruritus. Symptoms persistent and nothing makes them better or worse. No treatment used prior to arrival.  Patient is a 59 y.o. male presenting with headaches and chest pain. The history is provided by the patient.  Headache Chest Pain Associated symptoms: headache     Past Medical History  Diagnosis Date  . Back pain   . Anxiety    Past Surgical History  Procedure Laterality Date  . Rotator cuff repair     History reviewed. No pertinent family history. History  Substance Use Topics  . Smoking status: Current Some Day Smoker    Types: Cigarettes  . Smokeless tobacco: Not on file  . Alcohol Use: Yes     Comment: every other weekend    Review of Systems  Cardiovascular: Positive for chest pain.  Neurological: Positive for headaches.  All other systems reviewed and are negative.     Allergies  Ibuprofen; Tramadol; and Vicodin  Home Medications   Prior to Admission medications   Medication Sig Start Date End Date Taking? Authorizing Provider  ALPRAZolam Prudy Feeler) 0.5 MG tablet Take 0.5 mg by mouth 3 (three) times daily as needed for sleep or anxiety.   Yes Historical Provider, MD  dexlansoprazole (DEXILANT) 60 MG capsule Take 60 mg by mouth daily.   Yes Historical Provider, MD   HYDROcodone-acetaminophen (NORCO) 10-325 MG per tablet Take 1 tablet by mouth every 6 (six) hours as needed for pain.   Yes Historical Provider, MD  traMADol (ULTRAM) 50 MG tablet Take 50 mg by mouth every 6 (six) hours as needed for pain.   Yes Historical Provider, MD  naproxen (NAPROSYN) 500 MG tablet Take 1 tablet (500 mg total) by mouth 2 (two) times daily with a meal. Patient not taking: Reported on 11/11/2014 08/22/14   Einar Gip Dansie, PA-C  oxyCODONE-acetaminophen (PERCOCET/ROXICET) 5-325 MG per tablet Take 1 tablet by mouth every 4 (four) hours as needed for moderate pain or severe pain. Patient not taking: Reported on 11/11/2014 08/22/14   Einar Gip Dansie, PA-C   BP 120/59 mmHg  Pulse 56  Temp(Src) 97.8 F (36.6 C) (Oral)  Resp 16  SpO2 99% Physical Exam  Constitutional: He is oriented to person, place, and time. He appears well-developed and well-nourished.  Non-toxic appearance. No distress.  HENT:  Head: Normocephalic and atraumatic.  Eyes: Conjunctivae, EOM and lids are normal. Pupils are equal, round, and reactive to light.  Neck: Normal range of motion. Neck supple. No tracheal deviation present. No thyroid mass present.  Cardiovascular: Normal rate, regular rhythm and normal heart sounds.  Exam reveals no gallop.   No murmur heard. Pulmonary/Chest: Effort normal and breath sounds normal. No stridor. No respiratory distress. He has no decreased breath sounds. He has no wheezes. He has no rhonchi. He has no rales.  Abdominal: Soft. Normal appearance and  bowel sounds are normal. He exhibits no distension. There is no tenderness. There is no rebound and no CVA tenderness.  Musculoskeletal: Normal range of motion. He exhibits no edema or tenderness.  Neurological: He is alert and oriented to person, place, and time. He has normal strength. No cranial nerve deficit or sensory deficit. GCS eye subscore is 4. GCS verbal subscore is 5. GCS motor subscore is 6.  Skin: Skin  is warm and dry. No abrasion and no rash noted.  Psychiatric: He has a normal mood and affect. His speech is normal and behavior is normal.  Nursing note and vitals reviewed.   ED Course  Procedures (including critical care time) Labs Review Labs Reviewed  CBC  BASIC METABOLIC PANEL  I-STAT TROPOININ, ED    Imaging Review Dg Chest 2 View  11/11/2014   CLINICAL DATA:  Headache in chest pain for 2 weeks, question black mold exposure, smoker  EXAM: CHEST  2 VIEW  COMPARISON:  03/21/2013, 07/08/2011  FINDINGS: Mild enlargement of cardiac silhouette.  Mediastinal contours and pulmonary vascularity normal.  Nodular density versus artifact RIGHT upper lobe 6 mm diameter, unchanged since 2012 question related to RIGHT second rib.  Lungs otherwise clear.  No pleural effusion or pneumothorax.  Bones unremarkable.  IMPRESSION: Mild enlargement of cardiac silhouette.  Questionable 6 mm nodular density RIGHT upper lobe, present on 07/08/2011 exam as well suspect related to RIGHT second rib.   Electronically Signed   By: Ulyses SouthwardMark  Boles M.D.   On: 11/11/2014 10:48     EKG Interpretation   Date/Time:  Tuesday November 11 2014 10:21:35 EST Ventricular Rate:  55 PR Interval:  137 QRS Duration: 145 QT Interval:  455 QTC Calculation: 435 R Axis:   113 Text Interpretation:  Sinus rhythm RBBB and LPFB Anterior infarct,  possibly acute Lateral leads are also involved Baseline wander in lead(s)  V3 No significant change since last tracing Confirmed by Garvey Westcott  MD,  Estevon Fluke ([id number]) on 11/11/2014 10:23:59 AM      MDM   Final diagnoses:  Headache   Labs and cxr without acute findings, will give referral to pulmonology    Toy BakerAnthony T Tasnim Balentine, MD 11/11/14 [id number]

## 2014-11-11 NOTE — Telephone Encounter (Signed)
Spoke with pt and given consult appt with Dr Sherene SiresWert per Ed discharge note.

## 2014-11-13 ENCOUNTER — Encounter: Payer: Self-pay | Admitting: Internal Medicine

## 2014-11-13 ENCOUNTER — Ambulatory Visit (INDEPENDENT_AMBULATORY_CARE_PROVIDER_SITE_OTHER): Payer: Medicaid Other | Admitting: Internal Medicine

## 2014-11-13 ENCOUNTER — Other Ambulatory Visit (INDEPENDENT_AMBULATORY_CARE_PROVIDER_SITE_OTHER): Payer: Medicaid Other

## 2014-11-13 VITALS — BP 120/80 | HR 60 | Ht 66.0 in | Wt 191.6 lb

## 2014-11-13 DIAGNOSIS — R05 Cough: Secondary | ICD-10-CM | POA: Diagnosis not present

## 2014-11-13 DIAGNOSIS — R059 Cough, unspecified: Secondary | ICD-10-CM | POA: Insufficient documentation

## 2014-11-13 DIAGNOSIS — Z720 Tobacco use: Secondary | ICD-10-CM | POA: Diagnosis not present

## 2014-11-13 DIAGNOSIS — F17210 Nicotine dependence, cigarettes, uncomplicated: Secondary | ICD-10-CM

## 2014-11-13 LAB — CBC WITH DIFFERENTIAL/PLATELET
BASOS PCT: 0.3 % (ref 0.0–3.0)
Basophils Absolute: 0 10*3/uL (ref 0.0–0.1)
Eosinophils Absolute: 0.1 10*3/uL (ref 0.0–0.7)
Eosinophils Relative: 1.4 % (ref 0.0–5.0)
HCT: 40.4 % (ref 39.0–52.0)
Hemoglobin: 13.8 g/dL (ref 13.0–17.0)
LYMPHS PCT: 32 % (ref 12.0–46.0)
Lymphs Abs: 2.2 10*3/uL (ref 0.7–4.0)
MCHC: 34.2 g/dL (ref 30.0–36.0)
MCV: 88.3 fl (ref 78.0–100.0)
MONOS PCT: 5 % (ref 3.0–12.0)
Monocytes Absolute: 0.3 10*3/uL (ref 0.1–1.0)
NEUTROS ABS: 4.2 10*3/uL (ref 1.4–7.7)
NEUTROS PCT: 61.3 % (ref 43.0–77.0)
PLATELETS: 252 10*3/uL (ref 150.0–400.0)
RBC: 4.57 Mil/uL (ref 4.22–5.81)
RDW: 14.6 % (ref 11.5–15.5)
WBC: 6.8 10*3/uL (ref 4.0–10.5)

## 2014-11-13 LAB — SEDIMENTATION RATE: Sed Rate: 7 mm/hr (ref 0–22)

## 2014-11-13 NOTE — Patient Instructions (Signed)
The key is to stop smoking completely before smoking completely stops you!  Avoid further mold exposure if possible  Please remember to go to the lab  department downstairs for your tests - we will call you with the results when they are available.
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
## 2014-11-14 LAB — ALLERGY FULL PROFILE
Allergen, D pternoyssinus,d7: <0.1 kU/L
Aspergillus fumigatus, m3: <0.1 kU/L
Bahia Grass: <0.1 kU/L
Bermuda Grass: <0.1 kU/L
Box Elder IgE: <0.1 kU/L
Cat Dander: <0.1 kU/L
Curvularia lunata: <0.1 kU/L
D. farinae: <0.1 kU/L
Fescue: <0.1 kU/L
Goldenrod: <0.1 kU/L
Helminthosporium halodes: <0.1 kU/L
IGE (IMMUNOGLOBULIN E), SERUM: 10 kU/L (ref <115)
Stemphylium Botryosum: <0.1 kU/L
Sycamore Tree: <0.1 kU/L
Timothy Grass: <0.1 kU/L

## 2014-11-14 NOTE — Progress Notes (Signed)
Quick Note:  Spoke with pt and notified of results per Dr. Wert. Pt verbalized understanding and denied any questions.  ______ 

## 2014-11-15 DIAGNOSIS — F17210 Nicotine dependence, cigarettes, uncomplicated: Secondary | ICD-10-CM | POA: Insufficient documentation

## 2014-11-15 NOTE — Assessment & Plan Note (Signed)

## 2014-11-15 NOTE — Assessment & Plan Note (Addendum)
Allergy profile 11/13/2014 >  Eos 0.1/ IgE 10 neg RAST  The most common causes of chronic cough in immunocompetent adults include the following: upper airway cough syndrome (UACS), previously referred to as postnasal drip syndrome (PNDS), which is caused by variety of rhinosinus conditions; (2) asthma; (3) GERD; (4) chronic bronchitis from cigarette smoking or other inhaled environmental irritants; (5) nonasthmatic eosinophilic bronchitis; and (6) bronchiectasis.   These conditions, singly or in combination, have accounted for up to 94% of the causes of chronic cough in prospective studies.   Other conditions have constituted no >6% of the causes in prospective studies These have included bronchogenic carcinoma, chronic interstitial pneumonia, sarcoidosis, left ventricular failure, ACEI-induced cough, and aspiration from a condition associated with pharyngeal dysfunction.    Chronic cough is often simultaneously caused by more than one condition. A single cause has been found from 38 to 82% of the time, multiple causes from 18 to 62%. Multiply caused cough has been the result of three diseases up to 42% of the time.       For now rx for GERD  / stop smoking/ avoid any triggering factor  return for pfts
